# Patient Record
Sex: Female | Born: 1965 | Race: White | Hispanic: No | Marital: Married | Smoking: Former smoker
Health system: Southern US, Community
[De-identification: ages and names within clinical notes are randomized; demographics above are authoritative.]

## PROBLEM LIST (undated history)

## (undated) DIAGNOSIS — K5090 Crohn's disease, unspecified, without complications: Secondary | ICD-10-CM

## (undated) DIAGNOSIS — R079 Chest pain, unspecified: Secondary | ICD-10-CM

## (undated) DIAGNOSIS — I10 Essential (primary) hypertension: Secondary | ICD-10-CM

## (undated) DIAGNOSIS — R002 Palpitations: Secondary | ICD-10-CM

## (undated) DIAGNOSIS — E785 Hyperlipidemia, unspecified: Secondary | ICD-10-CM

## (undated) DIAGNOSIS — Z9289 Personal history of other medical treatment: Secondary | ICD-10-CM

## (undated) DIAGNOSIS — R0989 Other specified symptoms and signs involving the circulatory and respiratory systems: Secondary | ICD-10-CM

## (undated) DIAGNOSIS — D573 Sickle-cell trait: Secondary | ICD-10-CM

## (undated) HISTORY — DX: Personal history of other medical treatment: Z92.89

## (undated) HISTORY — DX: Crohn's disease, unspecified, without complications: K50.90

## (undated) HISTORY — DX: Chest pain, unspecified: R07.9

## (undated) HISTORY — DX: Palpitations: R00.2

## (undated) HISTORY — DX: Sickle-cell trait: D57.3

## (undated) HISTORY — DX: Hyperlipidemia, unspecified: E78.5

## (undated) HISTORY — DX: Essential (primary) hypertension: I10

## (undated) HISTORY — DX: Other specified symptoms and signs involving the circulatory and respiratory systems: R09.89

---

## 1998-04-02 ENCOUNTER — Encounter: Payer: Self-pay | Admitting: Gastroenterology

## 1998-04-02 ENCOUNTER — Ambulatory Visit (HOSPITAL_COMMUNITY): Admission: RE | Admit: 1998-04-02 | Discharge: 1998-04-02 | Payer: Self-pay | Admitting: Gastroenterology

## 1998-04-20 ENCOUNTER — Ambulatory Visit (HOSPITAL_COMMUNITY): Admission: RE | Admit: 1998-04-20 | Discharge: 1998-04-20 | Payer: Self-pay | Admitting: Gastroenterology

## 1998-04-26 ENCOUNTER — Encounter: Payer: Self-pay | Admitting: Gastroenterology

## 1998-04-26 ENCOUNTER — Ambulatory Visit (HOSPITAL_COMMUNITY): Admission: RE | Admit: 1998-04-26 | Discharge: 1998-04-26 | Payer: Self-pay | Admitting: Gastroenterology

## 1998-05-10 ENCOUNTER — Other Ambulatory Visit: Admission: RE | Admit: 1998-05-10 | Discharge: 1998-05-10 | Payer: Self-pay | Admitting: Obstetrics and Gynecology

## 1998-05-10 ENCOUNTER — Emergency Department (HOSPITAL_COMMUNITY): Admission: EM | Admit: 1998-05-10 | Discharge: 1998-05-10 | Payer: Self-pay | Admitting: Internal Medicine

## 1998-05-10 ENCOUNTER — Encounter: Payer: Self-pay | Admitting: Internal Medicine

## 1998-06-20 ENCOUNTER — Other Ambulatory Visit: Admission: RE | Admit: 1998-06-20 | Discharge: 1998-06-20 | Payer: Self-pay | Admitting: Obstetrics and Gynecology

## 1998-10-25 ENCOUNTER — Encounter: Payer: Self-pay | Admitting: Gastroenterology

## 1998-10-25 ENCOUNTER — Ambulatory Visit (HOSPITAL_COMMUNITY): Admission: RE | Admit: 1998-10-25 | Discharge: 1998-10-25 | Payer: Self-pay | Admitting: Gastroenterology

## 1998-11-26 ENCOUNTER — Ambulatory Visit (HOSPITAL_COMMUNITY): Admission: RE | Admit: 1998-11-26 | Discharge: 1998-11-26 | Payer: Self-pay | Admitting: Obstetrics and Gynecology

## 1999-05-09 ENCOUNTER — Other Ambulatory Visit: Admission: RE | Admit: 1999-05-09 | Discharge: 1999-05-09 | Payer: Self-pay | Admitting: Obstetrics and Gynecology

## 1999-05-17 ENCOUNTER — Emergency Department (HOSPITAL_COMMUNITY): Admission: EM | Admit: 1999-05-17 | Discharge: 1999-05-17 | Payer: Self-pay | Admitting: Emergency Medicine

## 1999-09-08 ENCOUNTER — Encounter: Payer: Self-pay | Admitting: Neurosurgery

## 1999-09-08 ENCOUNTER — Ambulatory Visit (HOSPITAL_COMMUNITY): Admission: RE | Admit: 1999-09-08 | Discharge: 1999-09-08 | Payer: Self-pay | Admitting: Neurosurgery

## 1999-09-19 ENCOUNTER — Encounter: Admission: RE | Admit: 1999-09-19 | Discharge: 1999-09-19 | Payer: Self-pay | Admitting: Neurosurgery

## 1999-09-19 ENCOUNTER — Encounter: Payer: Self-pay | Admitting: Neurosurgery

## 2000-07-29 ENCOUNTER — Other Ambulatory Visit: Admission: RE | Admit: 2000-07-29 | Discharge: 2000-07-29 | Payer: Self-pay | Admitting: Obstetrics and Gynecology

## 2000-09-03 ENCOUNTER — Encounter: Payer: Self-pay | Admitting: Gastroenterology

## 2000-09-03 ENCOUNTER — Ambulatory Visit (HOSPITAL_COMMUNITY): Admission: RE | Admit: 2000-09-03 | Discharge: 2000-09-03 | Payer: Self-pay | Admitting: Gastroenterology

## 2001-08-17 ENCOUNTER — Other Ambulatory Visit: Admission: RE | Admit: 2001-08-17 | Discharge: 2001-08-17 | Payer: Self-pay | Admitting: Obstetrics and Gynecology

## 2002-02-11 ENCOUNTER — Encounter: Payer: Self-pay | Admitting: Obstetrics and Gynecology

## 2002-02-11 ENCOUNTER — Encounter: Admission: RE | Admit: 2002-02-11 | Discharge: 2002-02-11 | Payer: Self-pay | Admitting: Obstetrics and Gynecology

## 2002-05-28 ENCOUNTER — Encounter: Payer: Self-pay | Admitting: Emergency Medicine

## 2002-05-28 ENCOUNTER — Emergency Department (HOSPITAL_COMMUNITY): Admission: EM | Admit: 2002-05-28 | Discharge: 2002-05-29 | Payer: Self-pay | Admitting: Emergency Medicine

## 2002-08-23 ENCOUNTER — Other Ambulatory Visit: Admission: RE | Admit: 2002-08-23 | Discharge: 2002-08-23 | Payer: Self-pay | Admitting: Obstetrics and Gynecology

## 2003-01-02 ENCOUNTER — Observation Stay (HOSPITAL_COMMUNITY): Admission: RE | Admit: 2003-01-02 | Discharge: 2003-01-03 | Payer: Self-pay | Admitting: General Surgery

## 2003-01-02 ENCOUNTER — Encounter (INDEPENDENT_AMBULATORY_CARE_PROVIDER_SITE_OTHER): Payer: Self-pay

## 2003-12-18 ENCOUNTER — Other Ambulatory Visit: Admission: RE | Admit: 2003-12-18 | Discharge: 2003-12-18 | Payer: Self-pay | Admitting: Obstetrics and Gynecology

## 2003-12-22 ENCOUNTER — Ambulatory Visit (HOSPITAL_BASED_OUTPATIENT_CLINIC_OR_DEPARTMENT_OTHER): Admission: RE | Admit: 2003-12-22 | Discharge: 2003-12-22 | Payer: Self-pay | Admitting: Obstetrics and Gynecology

## 2003-12-22 ENCOUNTER — Ambulatory Visit (HOSPITAL_COMMUNITY): Admission: RE | Admit: 2003-12-22 | Discharge: 2003-12-22 | Payer: Self-pay | Admitting: Obstetrics and Gynecology

## 2003-12-22 ENCOUNTER — Encounter (INDEPENDENT_AMBULATORY_CARE_PROVIDER_SITE_OTHER): Payer: Self-pay | Admitting: *Deleted

## 2006-09-10 ENCOUNTER — Encounter: Admission: RE | Admit: 2006-09-10 | Discharge: 2006-09-10 | Payer: Self-pay | Admitting: Obstetrics and Gynecology

## 2008-04-20 ENCOUNTER — Ambulatory Visit: Payer: Self-pay | Admitting: Diagnostic Radiology

## 2008-04-20 ENCOUNTER — Ambulatory Visit (HOSPITAL_BASED_OUTPATIENT_CLINIC_OR_DEPARTMENT_OTHER): Admission: RE | Admit: 2008-04-20 | Discharge: 2008-04-20 | Payer: Self-pay | Admitting: Family Medicine

## 2008-05-03 ENCOUNTER — Encounter: Admission: RE | Admit: 2008-05-03 | Discharge: 2008-05-03 | Payer: Self-pay

## 2008-05-09 DIAGNOSIS — R0989 Other specified symptoms and signs involving the circulatory and respiratory systems: Secondary | ICD-10-CM

## 2008-05-09 HISTORY — DX: Other specified symptoms and signs involving the circulatory and respiratory systems: R09.89

## 2009-06-19 ENCOUNTER — Encounter: Admission: RE | Admit: 2009-06-19 | Discharge: 2009-06-19 | Payer: Self-pay | Admitting: Family Medicine

## 2009-09-24 ENCOUNTER — Encounter: Admission: RE | Admit: 2009-09-24 | Discharge: 2009-09-24 | Payer: Self-pay | Admitting: Gastroenterology

## 2010-06-01 IMAGING — US US ABDOMEN COMPLETE
1 series · 14 of 25 positions shown · non-contrast
Comparison: None

CLINICAL DATA: Left upper quadrant pain.  Evaluate for
pancreatitis.  Cholecystectomy.  Appendectomy and hysterectomy.
Crohns disease.

ABDOMEN ULTRASOUND
TECHNIQUE: Complete abdominal ultrasound examination was performed
including evaluation of the liver, gallbladder, bile ducts,
pancreas, kidneys, spleen, IVC, and abdominal aorta.

[Series 1: us abdomen complete · 0.35mm/px · 14 of 59 slices shown]
[im 1/59]
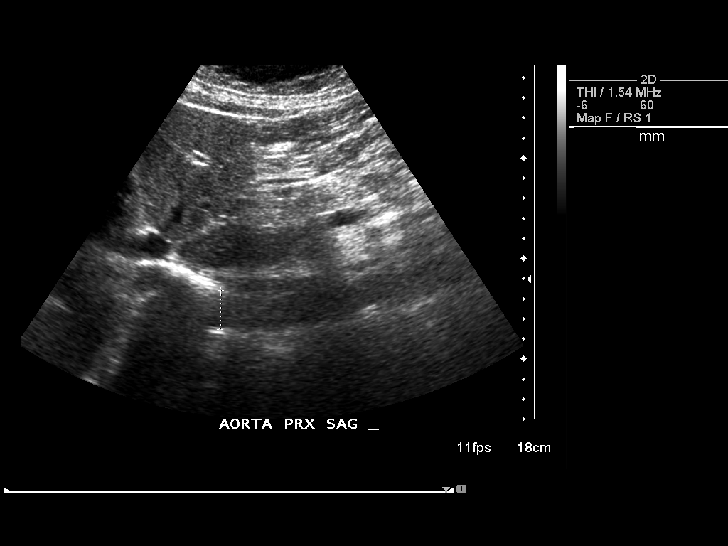
[im 5/59]
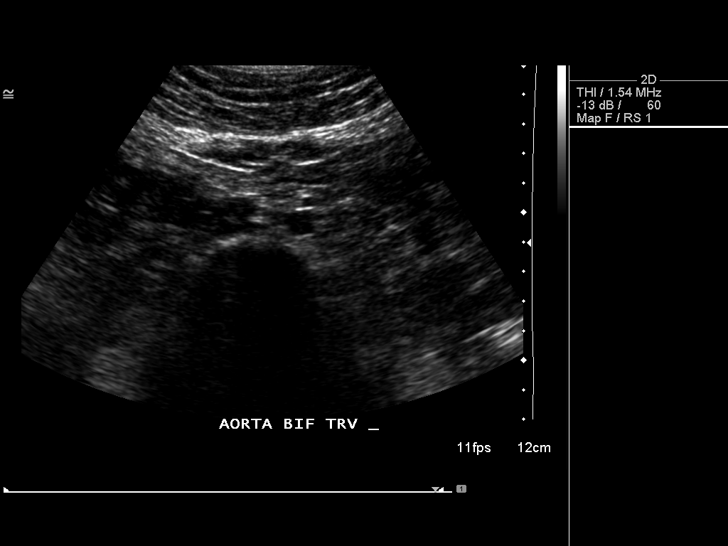
[im 10/59]
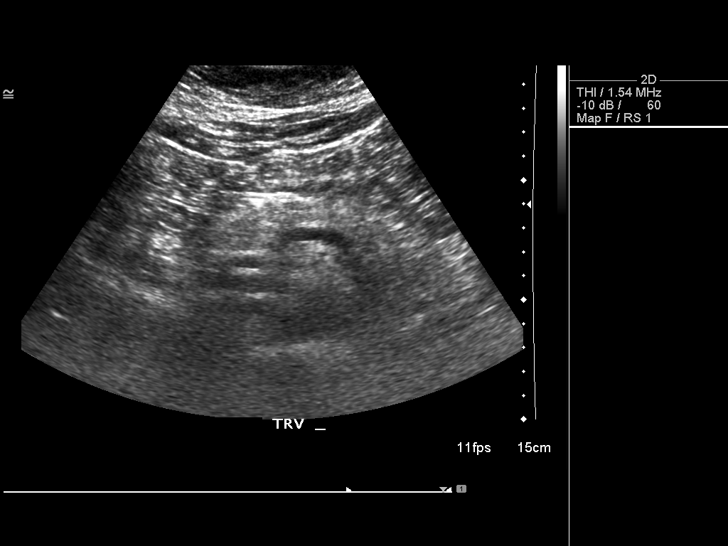
[im 15/59]
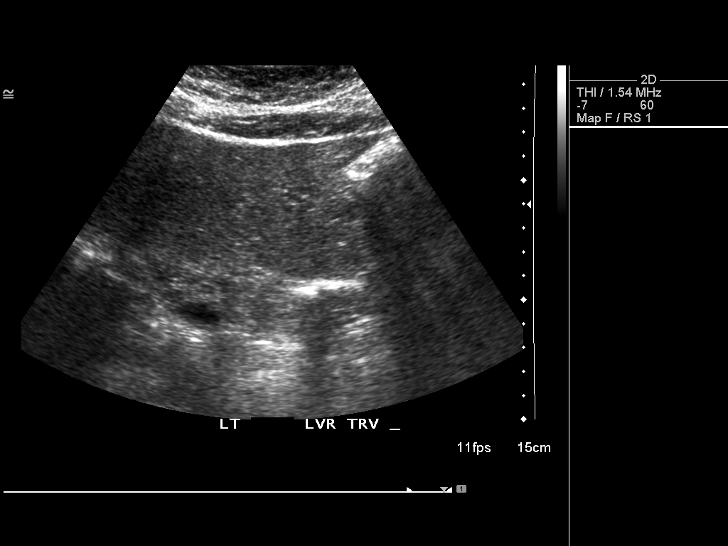
[im 20/59]
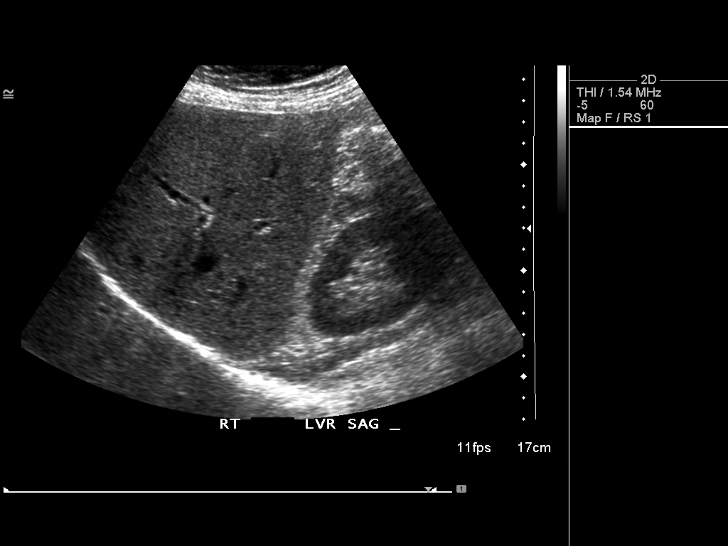
[im 22/59]
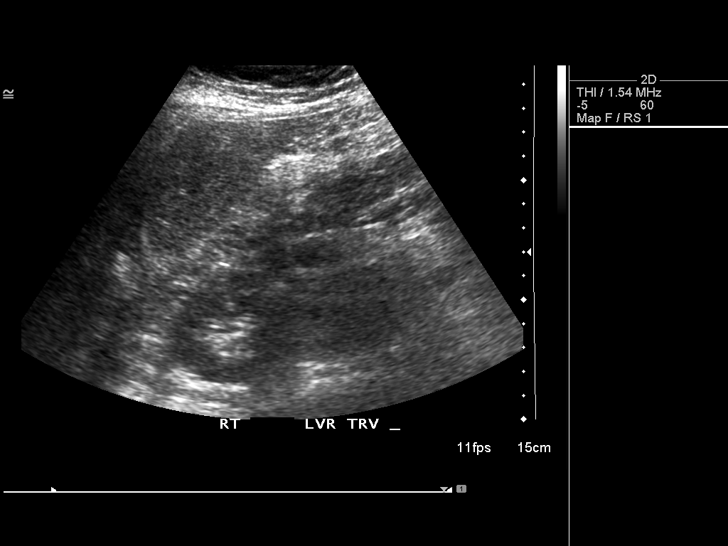
[im 27/59]
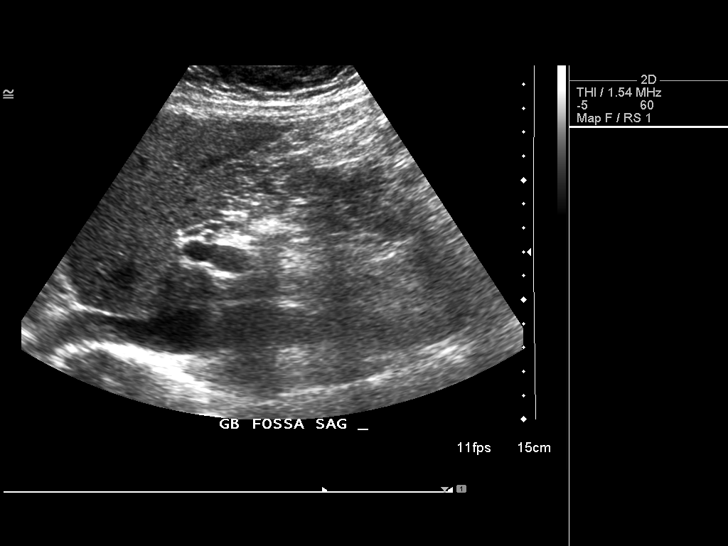
[im 32/59]
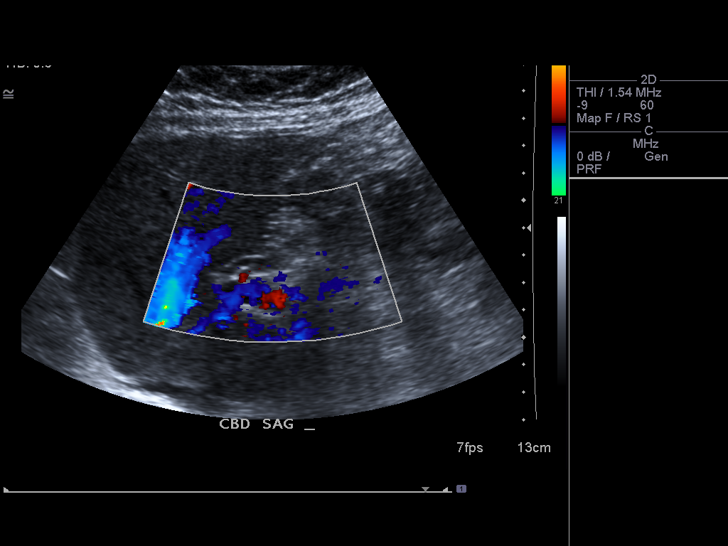
[im 37/59]
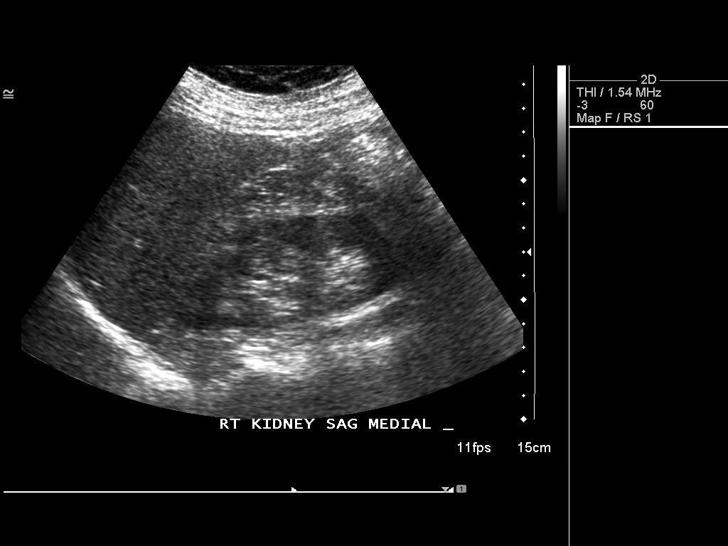
[im 39/59]
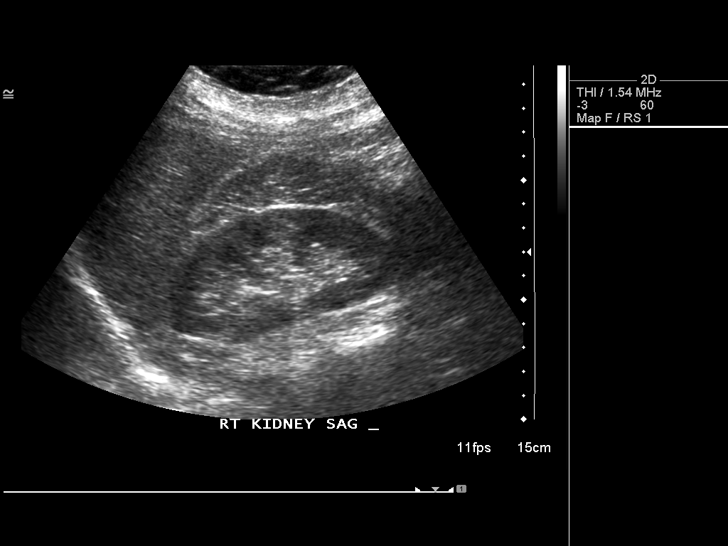
[im 44/59]
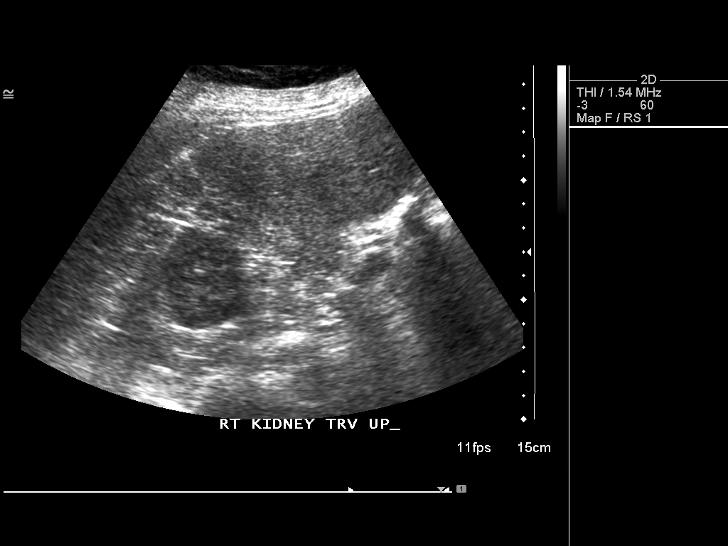
[im 49/59]
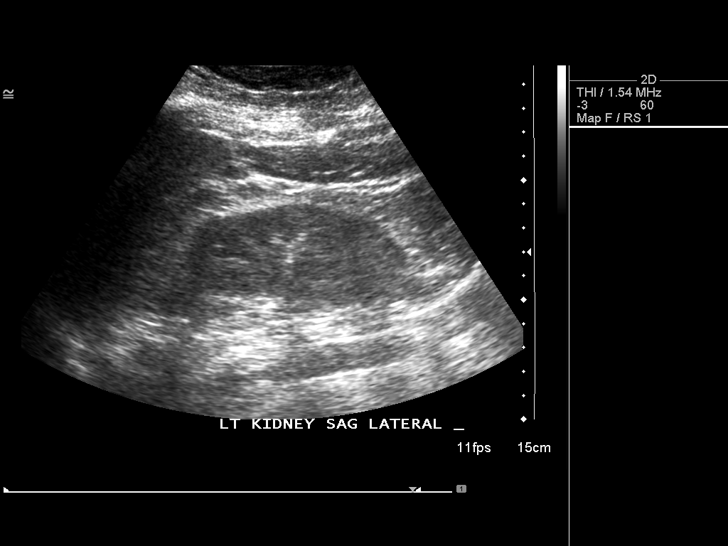
[im 54/59]
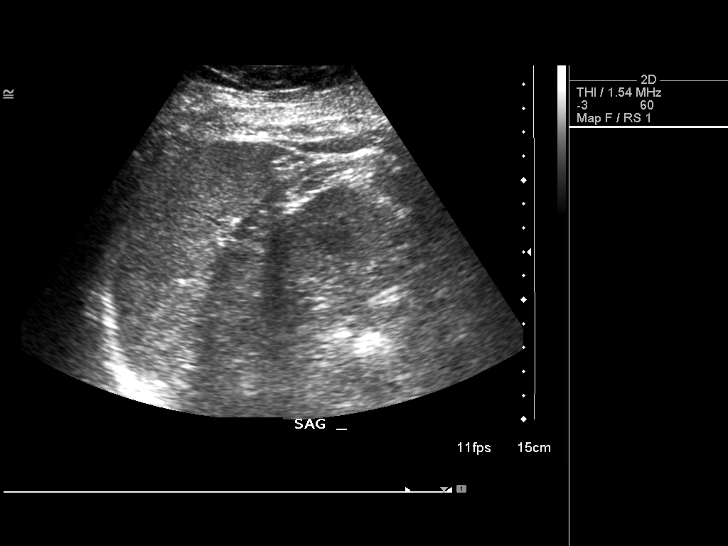
[im 59/59]
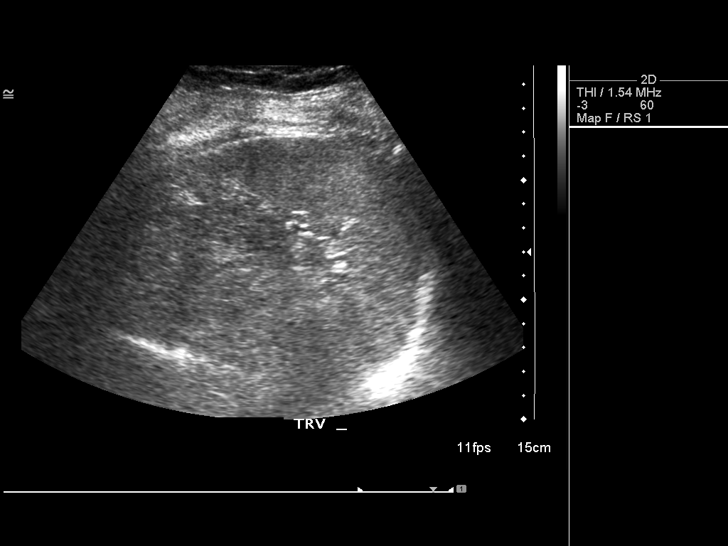

[14 of 25 positions shown; findings below may reference images not displayed]

FINDINGS: The gallbladder has been removed.  The common bile duct
is 3.4 mm.  The liver appears normal.  The IVC is not well seen due
to bowel gas and body habitus.  Pancreas is not enlarged and there
is no pancreatic mass or edema.  There is no peripancreatic fluid.
The spleen is 11.5 cm in length.  The kidneys are normal and the
aorta shows no dilatation, measuring 2.0 cm in diameter.
IMPRESSION: No acute abnormality.  There is no biliary dilatation following
cholecystectomy.

## 2010-06-28 NOTE — Op Note (Signed)
Joan Richardson, [name]                   ACCOUNT NO.:  [id number]   MEDICAL RECORD NO.:  [id number]          PATIENT TYPE:  AMB   LOCATION:  [facility]                         FACILITY:  [facility]   PHYSICIAN:  Cynthia P. Romine, M.D.DATE OF BIRTH:  [birth year]   DATE OF PROCEDURE:  12/22/2003  DATE OF DISCHARGE:                                 OPERATIVE REPORT   PREOPERATIVE DIAGNOSES:  Known endometrial polyp.   POSTOPERATIVE DIAGNOSES:  Known endometrial polyp. Pathology pending.   PROCEDURE:  Hysteroscopic resection of endometrial polyp, D&C.   SURGEON:  Cynthia P. Romine, M.D.   ANESTHESIA:  General by LMA.   ESTIMATED BLOOD LOSS:  Minimal.   COMPLICATIONS:  None.   SORBITOL DEFICIT:  40 mL.   DESCRIPTION OF PROCEDURE:  The patient was taken to the operating room and  after the induction of adequate general anesthesia by LMA was placed in  dorsal lithotomy position and prepped and draped in the usual fashion.  A  posterior weighted and anterior Sims retractors were placed, the cervix was  grasped off the anterior lip with a single tooth tenaculum.  The uterus  sounded to 8 cm.  The cervix was dilated to a #31 Kennon Rounds, the operative  hysteroscope was introduced using sorbitol for distention medium and the  sorbitol pump was set at a pressure of 70.  The polyp could be seen coming  from its anterior fundal location.  The remainder of the endometrial cavity  appeared clear.  In order to get good visualization, the pressure on the  pump needed to be turned up to 85 mmHg. A single loop was used to resect the  polyp in several pieces. The hysteroscope was withdrawn, sharp curettage was  carried out and the hysteroscope was then reintroduced. The cavity appeared  clear and the procedure was terminated. The instrument was removed from the  vagina and the patient was taken to the recovery room in satisfactory  condition.      CPR/MEDQ  D:  12/22/2003  T:  12/23/2003  Job:  [id number]

## 2010-06-28 NOTE — Op Note (Signed)
Joan Richardson, Joan Richardson                             ACCOUNT NO.:  [id number]   MEDICAL RECORD NO.:  [id number]                   PATIENT TYPE:  OBV   LOCATION:  9741                                 FACILITY:  [facility]   PHYSICIAN:  Cynthia P. Romine, M.D.             DATE OF BIRTH:  [birth year]   DATE OF PROCEDURE:  01/02/2003  DATE OF DISCHARGE:  01/03/2003                                 OPERATIVE REPORT   PREOPERATIVE DIAGNOSIS:  Desire for attempt at permanent surgical  sterilization.   POSTOPERATIVE DIAGNOSIS:  Desire for attempt at permanent surgical  sterilization.   PROCEDURE:  Laparoscopic Falope ring bilateral tubal sterilization.   SURGEON:  Cynthia P. Romine, M.D.   ANESTHESIA:  General endotracheal.   ESTIMATED BLOOD LOSS:  None.   COMPLICATIONS:  None.   DESCRIPTION OF PROCEDURE:  The patient had already had laparoscopy done to  remove her gallbladder by Dr. Marlou Starks and it was after the gallbladder was  removed that I came to the operating room to perform the tubal sterilization  procedure.  The umbilical port already was in place and inspection of the  pelvis revealed it to be normal.  A small incision was made suprapubically  and an 8-mm trocar was inserted under direct visualization.  The Falope ring  applier was introduced, the patient's right fallopian tube was identified  and traced to its fimbriated end, the midportion was elevated and the Falope  ring was placed.  A good knuckle of tube was noted to be contained within  the ring and good blanching was noted.  The procedure was repeated on the  patient's left, identifying the tube and tracing it to its fimbriated end.  The midportion was elevated and the Falope ring was placed.  Again, a good  knuckle of tube was noted to be contained within the ring and good blanching  of the tube was evident.  There was no bleeding.  Photographic documentation  was taken and my part of the procedure was then terminated with Dr.   Marlou Starks to  close the incision.  The Falope ring applier and the sleeve were removed and  the suprapubic incision was closed subcuticularly with 4-0 Dexon.                                               Cynthia P. Romine, M.D.    CPR/MEDQ  D:  02/14/2003  T:  02/14/2003  Job:  [id number]

## 2010-06-28 NOTE — Op Note (Signed)
NAMEKIRSTY, Joan Richardson                             ACCOUNT NO.:  [id number]   MEDICAL RECORD NO.:  [id number]                   PATIENT TYPE:  OBV   LOCATION:  8676                                 FACILITY:  [facility]   PHYSICIAN:  Sammuel Hines. Daiva Nakayama, M.D.              DATE OF BIRTH:  August 20, 1965   DATE OF PROCEDURE:  01/02/2003  DATE OF DISCHARGE:                                 OPERATIVE REPORT   PREOPERATIVE DIAGNOSIS:  Biliary dyskinesia.   POSTOPERATIVE DIAGNOSIS:  Biliary dyskinesia.   OPERATION/PROCEDURE:  Laparoscopic cholecystectomy.   OPERATION/PROCEDURE:  Laparoscopic cholecystectomy.   SURGEON:  Sammuel Hines. Marlou Starks, M.D.   ASSISTANT:  Shellia Carwin, M.D.   ANESTHESIA:  General endotracheal.   DESCRIPTION OF PROCEDURE:  After informed consent was obtained, the patient  was brought to the operating room and placed in the supine position on the  operating room table.  After adequate induction of general anesthesia, the  patient was placed in the lithotomy position so that she could have a tubal  ligation by GYN during this same procedure.  Her abdomen and perineum were  then prepped with Betadine and draped in the usual sterile manner.  The area  below the umbilicus was infiltrated with 0.25% Marcaine.  A small incision  was made with the 15-blade knife.  This incision was carried down through  the subcutaneous tissue bluntly with the Kelly clamp and [employer]  retractors until the linea alba was identified.  The linea alba was incised  with the 15-blade knife and each side was grasped with Kocher clamps and  elevated anteriorly.  The preperitoneal space was then probed bluntly with a  hemostat until the peritoneum was opened and access was gained to the  abdominal cavity.  A 0 Vicryl figure-of-eight stitch was placed in the  fascia surrounding the opening. The Hasson cannula was placed through the  opening and anchored in place with the previously placed Vicryl stitch.  The  abdomen was then insufflated with carbon dioxide without difficulty.  The  patient was placed in a head-up position.  The laparoscope was placed  through the Hasson cannula and the right upper quadrant was inspected.  The  liver and dome of the gallbladder were readily identified.  The epigastric  region was then infiltrated with 0.25% Marcaine.  A small incision was made  with the 15-blade knife and a 10 mm port was placed bluntly through this  incision into the abdominal cavity under direct vision.  Sites were then  chosen laterally on the right side of the abdomen for placement of a 5 mm  ports.  Each of these areas was infiltrated with 0.25% Marcaine.  Small stab  incisions were made with 15-blade knife and 5 mm ports were placed bluntly  through these incisions into the abdominal cavity under direct vision.  A  blunt grasper was placed through the  lateral-most 5 mm port and used to  grasp the dome of the gallbladder and elevate it anteriorly and superiorly.  Another blunt grasper was placed through the other 5 mm port and used to  retract on the body and neck of the gallbladder.  The dissector was placed  through the epigastric port and using the electrocautery, the peritoneal  reflection at the gallbladder neck was opened.  Blunt dissection was then  carried out in this area until the gallbladder neck-cystic duct junction was  readily identified and a good window was created.  The anatomy was very  clear.  Since the patient had normal liver functions and was in a position  on the table to have both procedures done, this position would not allow  cholangiogram to be done and two clips were placed proximally on the cystic  duct and one distally and the duct was divided between the two sets of  clips.  Posterior to this, the cystic artery was identified and again  dissected in a circumferential manner bluntly until a good window was  created.  Three clips were placed proximally and one  distally on the artery  and the artery was divided between the two.  Next, the laparoscopic hook  cautery device was used to separate the gallbladder from the liver bed.  Prior to completely detaching the gallbladder from the liver bed, the liver  bed was inspected and several small bleeding points were coagulated with the  electrocautery.  The gallbladder was then detached the rest of the way from  the liver bed without difficulty.  The laparoscope was then moved to the  epigastric port and a gallbladder grasper was placed through the Hasson  cannula and used to grasp gallbladder.  The gallbladder was then removed  with the Hasson cannula through the infraumbilical port without difficulty.  The Hasson cannula was then replaced.   The procedure was then turned over to Dr. Joan Flores for a bilateral tubal  ligation.  Her portion of the procedure will be dictated separately.   Once her portion of the procedure was complete, we reinspected the liver bed  which was completely hemostatic.  The ports were then all removed under  direct vision and found to be hemostatic.  The gas was allowed to escape.  The fascial defect at the umbilicus was closed with the previously placed  Vicryl stitch as well as with another interrupted Vicryl stitch. The skin  incisions were all closed with interrupted 4-0 Monocryl subcuticular  stitches.  Benzoin and Steri-Strips and sterile dressings were applied.  The  patient tolerated the procedure well.  At the end of the case all needle,  sponge and instrument counts were correct.  The patient was awakened and  taken to the recovery room in stable condition.                                               Sammuel Hines. Daiva Nakayama, M.D.    PST/MEDQ  D:  01/02/2003  T:  01/02/2003  Job:  [id number]

## 2010-12-17 ENCOUNTER — Other Ambulatory Visit: Payer: Self-pay | Admitting: Family Medicine

## 2010-12-17 DIAGNOSIS — Z1231 Encounter for screening mammogram for malignant neoplasm of breast: Secondary | ICD-10-CM

## 2010-12-18 ENCOUNTER — Ambulatory Visit
Admission: RE | Admit: 2010-12-18 | Discharge: 2010-12-18 | Disposition: A | Discharge status: Home / Self Care | Payer: BC Managed Care – PPO | Source: Ambulatory Visit | Attending: Family Medicine | Admitting: Family Medicine

## 2010-12-18 ENCOUNTER — Other Ambulatory Visit: Payer: Self-pay | Admitting: Family Medicine

## 2010-12-18 DIAGNOSIS — Z1231 Encounter for screening mammogram for malignant neoplasm of breast: Secondary | ICD-10-CM

## 2010-12-31 ENCOUNTER — Ambulatory Visit: Payer: Self-pay

## 2011-08-13 ENCOUNTER — Ambulatory Visit (HOSPITAL_BASED_OUTPATIENT_CLINIC_OR_DEPARTMENT_OTHER)
Admission: RE | Admit: 2011-08-13 | Discharge: 2011-08-13 | Disposition: A | Discharge status: Home / Self Care | Payer: BC Managed Care – PPO | Source: Ambulatory Visit | Attending: Family Medicine | Admitting: Family Medicine

## 2011-08-13 ENCOUNTER — Other Ambulatory Visit (HOSPITAL_BASED_OUTPATIENT_CLINIC_OR_DEPARTMENT_OTHER): Payer: Self-pay | Admitting: Family Medicine

## 2011-08-13 DIAGNOSIS — M542 Cervicalgia: Secondary | ICD-10-CM | POA: Insufficient documentation

## 2011-08-13 DIAGNOSIS — R599 Enlarged lymph nodes, unspecified: Secondary | ICD-10-CM | POA: Insufficient documentation

## 2011-08-13 MED ORDER — IOHEXOL 300 MG/ML  SOLN
80.0000 mL | Freq: Once | INTRAMUSCULAR | Status: AC | PRN
  Administered 2011-08-13: 80 mL via INTRAVENOUS

## 2011-12-10 DIAGNOSIS — I10 Essential (primary) hypertension: Secondary | ICD-10-CM

## 2011-12-10 HISTORY — DX: Essential (primary) hypertension: I10

## 2011-12-22 DIAGNOSIS — R002 Palpitations: Secondary | ICD-10-CM

## 2011-12-22 HISTORY — DX: Palpitations: R00.2

## 2012-02-26 ENCOUNTER — Other Ambulatory Visit: Payer: Self-pay | Admitting: Family Medicine

## 2012-02-26 DIAGNOSIS — Z1231 Encounter for screening mammogram for malignant neoplasm of breast: Secondary | ICD-10-CM

## 2012-03-23 ENCOUNTER — Ambulatory Visit
Admission: RE | Admit: 2012-03-23 | Discharge: 2012-03-23 | Disposition: A | Discharge status: Home / Self Care | Payer: BC Managed Care – PPO | Source: Ambulatory Visit | Attending: Family Medicine | Admitting: Family Medicine

## 2012-03-23 DIAGNOSIS — Z1231 Encounter for screening mammogram for malignant neoplasm of breast: Secondary | ICD-10-CM

## 2012-08-12 ENCOUNTER — Encounter: Payer: Self-pay | Admitting: *Deleted

## 2012-08-12 ENCOUNTER — Encounter: Payer: Self-pay | Admitting: Cardiovascular Disease

## 2012-08-17 ENCOUNTER — Encounter: Payer: Self-pay | Admitting: Cardiovascular Disease

## 2012-08-17 ENCOUNTER — Ambulatory Visit (INDEPENDENT_AMBULATORY_CARE_PROVIDER_SITE_OTHER): Payer: BC Managed Care – PPO | Admitting: Cardiovascular Disease

## 2012-08-17 VITALS — BP 104/76 | HR 73 | Ht 60.0 in | Wt 173.8 lb

## 2012-08-17 DIAGNOSIS — R5381 Other malaise: Secondary | ICD-10-CM

## 2012-08-17 DIAGNOSIS — R079 Chest pain, unspecified: Secondary | ICD-10-CM

## 2012-08-17 DIAGNOSIS — R0602 Shortness of breath: Secondary | ICD-10-CM | POA: Insufficient documentation

## 2012-08-17 DIAGNOSIS — E782 Mixed hyperlipidemia: Secondary | ICD-10-CM

## 2012-08-17 DIAGNOSIS — I10 Essential (primary) hypertension: Secondary | ICD-10-CM

## 2012-08-17 DIAGNOSIS — R5383 Other fatigue: Secondary | ICD-10-CM

## 2012-08-17 DIAGNOSIS — Z8249 Family history of ischemic heart disease and other diseases of the circulatory system: Secondary | ICD-10-CM

## 2012-08-17 DIAGNOSIS — K5090 Crohn's disease, unspecified, without complications: Secondary | ICD-10-CM | POA: Insufficient documentation

## 2012-08-17 NOTE — Progress Notes (Signed)
Patient ID: Joan Richardson, female   DOB: February 02, 1966, 47 y.o.   MRN: [id number]     HPI: Joan Richardson, is a 47 y.o. female who presents to the office today for cardiology followup evaluation. I last saw her proximally 7 months ago.  Joan Richardson has a strong family history for premature coronary artery disease in multiple family members with several uncles having suffered myocardial infarctions in their late 55s and 51s. Her mother recently underwent stenting several weeks ago. The patient has experienced episodes of chest pain which at times are described as sharp and other times it is dull. At times there is left arm and neck radiation. She does note shortness of breath with walking. In October 2013 a nuclear perfusion study was normal. She has continued to experience symptoms.  There is remote tobacco history having started at age 48 but smoked more consistently at age 72. She quit smoking in 1994. She also has a history of hypertension, as well as mild hyperlipidemia. There is a history of Crohn's disease. 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 She's unaware of any African American heritage but she does admit to in the [facility] remotely.  In October 2013 an echo Doppler study showed normal systolic function but grade 1 diastolic dysfunction. She had mild MR and mild TR.  Past Medical History  Diagnosis Date  . Chest pain 12/10/11    stess test-post stress EF 85%- Exercise capacity is normal. Normal myocardial perfusion study. Low risk scan. No study for comparison.  . Palpitations 12/22/11  . Hyperlipidemia   . Sickle cell trait   . Hypertension 12/10/11    Echo- normal dystolic function grade 1 diastolic dysfuction. Mitral Valve E to A ratio is 0.65. evidence for mild MR and mild TR without other significant abnormalities.  . Crohn's disease   . Carotid bruit 05/09/08    carotid doppler-normal    No past surgical history on file.  Allergies  Allergen Reactions  . Imuran (Azathioprine)     . Lialda (Mesalamine)   . Septra (Sulfamethoxazole-Tmp Ds)     Current Outpatient Prescriptions  Medication Sig Dispense Refill  . ALPRAZolam (XANAX) 0.5 MG tablet Take 0.5 mg by mouth as needed.      . clidinium-chlordiazePOXIDE (LIBRAX) 2.5-5 MG per capsule Take 1 capsule by mouth as needed.      . hyoscyamine (LEVBID) 0.375 MG 12 hr tablet       . losartan-hydrochlorothiazide (HYZAAR) 50-12.5 MG per tablet Take 1 tablet by mouth daily.      . pantoprazole (PROTONIX) 20 MG tablet Take 20 mg by mouth 2 (two) times daily.      . traMADol (ULTRAM) 50 MG tablet Take 50 mg by mouth every 6 (six) hours as needed for pain.       No current facility-administered medications for this visit.    Socially she is married and has one child. She is trying to increase her exercise level. There is no recent tobacco use. There is no alcohol.  ROS is negative for fevers, chills or night sweats. She denies palpitations. She does admit to shortness of breath with walking. At times again she has noticed a sharp chest pain which is nonexertional but other times she does note a squeezing-type sensation. She denies bleeding. She denies recent exacerbation of her Crohn's disease. There has been discussion about use of Humira for her Crohn's disease. She denies edema  Other system review is negative.  PE BP  104/76  Pulse 73  Ht 5' (1.524 m)  Wt 173 lb 12.8 oz (78.835 kg)  BMI 33.94 kg/m2  General: Alert, oriented, no distress.  Skin: normal turgor, no rashes HEENT: Normocephalic, atraumatic. Pupils round and reactive; sclera anicteric;no lid lag.  Nose without nasal septal hypertrophy Mouth/Parynx benign; Mallinpatti scale 2 Neck: No JVD, no carotid briuts Lungs: clear to ausculatation and percussion; no wheezing or rales Heart: RRR, s1 s2 normal 1/6 systolic murmur. Her chest wall is nontender. Abdomen: soft, nontender; no hepatosplenomehaly, BS+; abdominal aorta nontender and not dilated by  palpation. Pulses 2+ Extremities: no clubbing cyanosis or edema, Homan's sign negative  Neurologic: grossly nonfocal  ECG: Sinus rhythm at 73 beats per minute without ectopy. She had normal intervals.  LABS:  BMET No results found for this basename: na, k, cl, co2, glucose, bun, creatinine, calcium, gfrnonaa, gfraa     Hepatic Function Panel  No results found for this basename: prot, albumin, ast, alt, alkphos, bilitot, bilidir, ibili     CBC No results found for this basename: wbc, rbc, hgb, hct, plt, mcv, mch, mchc, rdw, neutrabs, lymphsabs, monoabs, eosabs, basosabs     BNP No results found for this basename: probnp    Lipid Panel  No results found for this basename: chol, trig, hdl, cholhdl, vldl, ldlcalc     RADIOLOGY: No results found.    ASSESSMENT AND PLAN: This Joan Richardson is now 47 years old. Has a very strong family history for premature coronary artery disease and has cardiac risk factors also notable for hypertension, mild hyperlipidemia, as well as remote tobacco use. She previously had undergone a nuclear perfusion scan in October 2013 which showed normal perfusion. She has continued to experience shortness of breath particularly with exertion. I'm recommending she undergo a cardiopulmonary met test for further evaluation of maximal oxygen consumption, and to risk stratify for potential exercise-induced cardiac dysfunction in the etiology of her dyspnea versus pulmonary etiology. She may very well have microvascular angina. Subsequent laparoscopy obtained including a CBC with differential, comprehensive metabolic panel, NMR liposcience profile, TSH, vitamin D level. I will see her in 6-8 weeks for followup evaluation.     Troy Sine, MD, [facility]  08/17/2012 7:13 PM

## 2012-08-17 NOTE — Patient Instructions (Signed)
Your physician recommends that you return for lab work fasting.  Your physician has recommended that you have a cardiopulmonary stress test (CPX). CPX testing is a non-invasive measurement of heart and lung function. It replaces a traditional treadmill stress test. This type of test provides a tremendous amount of information that relates not only to your present condition but also for future outcomes. This test combines measurements of you ventilation, respiratory gas exchange in the lungs, electrocardiogram (EKG), blood pressure and physical response before, during, and following an exercise protocol.  Your physician recommends that you schedule a follow-up appointment in: 2 MONTHS.

## 2012-08-19 LAB — COMPREHENSIVE METABOLIC PANEL
Albumin: 4.4 g/dL (ref 3.5–5.2)
Alkaline Phosphatase: 123 U/L — ABNORMAL HIGH (ref 39–117)
BUN: 10 mg/dL (ref 6–23)
CO2: 24 mEq/L (ref 19–32)
Glucose, Bld: 102 mg/dL — ABNORMAL HIGH (ref 70–99)
Potassium: 4.6 mEq/L (ref 3.5–5.3)
Total Bilirubin: 0.5 mg/dL (ref 0.3–1.2)

## 2012-08-19 LAB — TSH: TSH: 2.144 u[IU]/mL (ref 0.350–4.500)

## 2012-08-19 LAB — CBC WITH DIFFERENTIAL/PLATELET
Eosinophils Absolute: 1.1 10*3/uL — ABNORMAL HIGH (ref 0.0–0.7)
Hemoglobin: 12.9 g/dL (ref 12.0–15.0)
Lymphocytes Relative: 26 % (ref 12–46)
Lymphs Abs: 2.9 10*3/uL (ref 0.7–4.0)
MCH: 26.6 pg (ref 26.0–34.0)
Monocytes Relative: 9 % (ref 3–12)
Neutrophils Relative %: 55 % (ref 43–77)
RBC: 4.85 MIL/uL (ref 3.87–5.11)
WBC: 11.5 10*3/uL — ABNORMAL HIGH (ref 4.0–10.5)

## 2012-08-20 LAB — NMR LIPOPROFILE WITH LIPIDS
HDL Size: 8.7 nm — ABNORMAL LOW (ref >=9.2)
HDL-C: 43 mg/dL (ref >=40)
LDL (calc): 108 mg/dL — ABNORMAL HIGH (ref <100)
LDL Size: 21 nm (ref >20.5)
LP-IR Score: 60 — ABNORMAL HIGH (ref <=45)

## 2012-08-23 LAB — VITAMIN D 1,25 DIHYDROXY: Vitamin D 1, 25 (OH)2 Total: 29 pg/mL (ref 18–72)

## 2012-09-13 ENCOUNTER — Encounter (INDEPENDENT_AMBULATORY_CARE_PROVIDER_SITE_OTHER): Payer: BC Managed Care – PPO

## 2012-09-13 DIAGNOSIS — R079 Chest pain, unspecified: Secondary | ICD-10-CM

## 2012-09-13 DIAGNOSIS — R0602 Shortness of breath: Secondary | ICD-10-CM

## 2012-09-24 ENCOUNTER — Telehealth: Payer: Self-pay | Admitting: Cardiovascular Disease

## 2012-09-24 NOTE — Telephone Encounter (Signed)
Wants to get met test results from 09/13/12  She cked My Chart and not finding  Please call

## 2012-09-24 NOTE — Telephone Encounter (Signed)
Returned call.  Pt informed message received r/t results.  RN apologized that she had not received them and informed instructions per Lab Result note.  Pt verbalized understanding.  Stated she does not want to take a -statin if she doesn't have to.  Stated she was on Zetia before and wanted to know if she could do that.  Pt also asked for Met Test Results.  Pt informed Dr. Claiborne Billings will be notified as Met Test has not been interpreted and Dr. Claiborne Billings will have to decide if she can take Zetia.  Pt verbalized understanding.  Pt then c/o having increased SOB.  Offered appt w/ Extender and pt declined.  Stated she would wait to see Dr. Claiborne Billings.  Pt informed Dr. Claiborne Billings to be out of the office next week and advised she see a PA sooner if SOB has increased.  Pt verbalized understanding and agreed w/ plan.  While scheduling appt, Dr. Evette Georges scheduled opened for Monday.  Appt scheduled at 9:45am w/ Dr. Claiborne Billings.  Pt was offered appt today w/ PA and declined.  Agreed to go to ER for worsening symptoms before Monday.  Will get results at appt.

## 2012-09-27 ENCOUNTER — Encounter: Payer: Self-pay | Admitting: Cardiovascular Disease

## 2012-09-27 ENCOUNTER — Ambulatory Visit (INDEPENDENT_AMBULATORY_CARE_PROVIDER_SITE_OTHER): Payer: BC Managed Care – PPO | Admitting: Cardiovascular Disease

## 2012-09-27 VITALS — BP 112/82 | HR 74 | Ht 63.0 in | Wt 178.5 lb

## 2012-09-27 DIAGNOSIS — E8881 Metabolic syndrome: Secondary | ICD-10-CM

## 2012-09-27 DIAGNOSIS — R0602 Shortness of breath: Secondary | ICD-10-CM

## 2012-09-27 DIAGNOSIS — R079 Chest pain, unspecified: Secondary | ICD-10-CM

## 2012-09-27 DIAGNOSIS — Z8249 Family history of ischemic heart disease and other diseases of the circulatory system: Secondary | ICD-10-CM

## 2012-09-27 DIAGNOSIS — E785 Hyperlipidemia, unspecified: Secondary | ICD-10-CM

## 2012-09-27 DIAGNOSIS — I5189 Other ill-defined heart diseases: Secondary | ICD-10-CM

## 2012-09-27 DIAGNOSIS — I10 Essential (primary) hypertension: Secondary | ICD-10-CM

## 2012-09-27 DIAGNOSIS — I519 Heart disease, unspecified: Secondary | ICD-10-CM

## 2012-09-27 MED ORDER — SIMVASTATIN 20 MG PO TABS: 20.0000 mg | ORAL_TABLET | Freq: Every evening | ORAL | Status: DC

## 2012-09-27 NOTE — Patient Instructions (Addendum)
  Your physician has recommended you make the following change in your medication: start vit-d 2000 iu daily. Prescription for simvastatin 20 mg daily. This has already been sent to your pharmacy.  Your physician recommends that you return for lab work in: 3 MONTHS.  Your physician recommends that you schedule a follow-up appointment in: 4 MONTHS.

## 2012-09-27 NOTE — Progress Notes (Signed)
Patient ID: Joan Richardson, female   DOB: [birth year], 47 y.o.   MRN: [id number]     HPI: Joan Richardson, is a 47 y.o. female who presents to the office today for cardiology followup evaluation. I last saw her last since that time she did have followup laboratory and underwent a cardiopulmonary test.  [name] has a strong family history for premature coronary artery disease in multiple family members with several uncles having suffered myocardial infarctions in their late 67s and 11s. Her mother recently underwent stenting several months ago. The patient has experienced episodes of chest pain which at times are described as sharp and other times as dull. At times there is left arm and neck radiation. She does note shortness of breath with walking. In October 2013 a nuclear perfusion study was normal. She has continued to experience symptoms.  There is remote tobacco history having started at age 43 but smoked more consistently at age 20. She quit smoking in 1994. She also has a history of hypertension, as well as mild hyperlipidemia. There is a history of Crohn's disease. 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 She's unaware of any African American heritage but she does admit to [place] remotely.  In October 2013 an echo Doppler study showed normal systolic function but grade 1 diastolic dysfunction. She had mild MR and mild TR.  Recent blood work has shown a total cholesterol 176 LDL calculated 108 LDL particle #1313 HDL particle number low at 29 and an increased insulin resistance scored 60.  On 09/13/2012 she underwent a cardiopulmonary met test. This revealed reduced functional status with maximum oxygen consumption of only 62% of predicted. Her cardiovascular response was impaired that study was considered indeterminate for myocardial dysfunction due to suboptimal peak cardiovascular stress load. She did have a low anaerobic threshold again suggestive of impaired peak cardiac function. There  also was evidence for ventilation/perfusion mismatch and possible increased her PFTs revealed normal FEV1 and FEV1/VC. DLCO was normal.  On prior echo, [name] did have normal systolic function with an ejection fraction greater than 55% and a grade 1 diastolic dysfunction the mitral valve E/A ratio of 0.65.  Apparently this past weekend, she developed episodes of chest pain leading to an evaluation at [facility] in [place]. She apparently had a normal chest CT. Cardiac enzymes were negative. Her chest pain was felt most likely non-cardiac. In addition, she tells me she has been followed by hematologist/oncologist for several months of low-grade fever and with the axillary lymph node. She has been anemic. Although the past he was discussion about starting Humira for her Crohn's disease she has not yet started this today these confounding issues. She presents for followup evaluation.   Past Medical History  Diagnosis Date  . Chest pain 12/10/11    stess test-post stress EF 85%- Exercise capacity is normal. Normal myocardial perfusion study. Low risk scan. No study for comparison.  . Palpitations 12/22/11  . Hyperlipidemia   . Sickle cell trait   . Hypertension 12/10/11    Echo- normal dystolic function grade 1 diastolic dysfuction. Mitral Valve E to A ratio is 0.65. evidence for mild MR and mild TR without other significant abnormalities.  . Crohn's disease   . Carotid bruit 05/09/08    carotid doppler-normal    History reviewed. No pertinent past surgical history.  Allergies  Allergen Reactions  . Imuran [Azathioprine]     pancreatitis  . Lialda [Mesalamine]   . Septra [  Sulfamethoxazole-Tmp Ds]     Current Outpatient Prescriptions  Medication Sig Dispense Refill  . ALPRAZolam (XANAX) 0.5 MG tablet Take 0.5 mg by mouth as needed.      . budesonide (ENTOCORT EC) 3 MG 24 hr capsule Take 9 mg by mouth every morning.      . clidinium-chlordiazePOXIDE (LIBRAX) 2.5-5 MG per  capsule Take 1 capsule by mouth as needed.      . hyoscyamine (LEVBID) 0.375 MG 12 hr tablet Take 0.375 mg by mouth every 12 (twelve) hours as needed.       Marland Kitchen losartan-hydrochlorothiazide (HYZAAR) 50-12.5 MG per tablet Take 1 tablet by mouth daily.      . traMADol (ULTRAM) 50 MG tablet Take 50 mg by mouth every 6 (six) hours as needed for pain.      . simvastatin (ZOCOR) 20 MG tablet Take 1 tablet (20 mg total) by mouth every evening.  90 tablet  3   No current facility-administered medications for this visit.    Socially she is married and has one child. She is trying to increase her exercise level. There is no recent tobacco use. There is no alcohol.  ROS is negative for fevers, chills or night sweats. She denies palpitations. She does admit to shortness of breath with walking. At times again she has noticed a sharp chest pain which is nonexertional but other times she does note a squeezing-type sensation. She denies bleeding. She denies recent exacerbation of her Crohn's disease. There has been discussion about use of Humira for her Crohn's disease. She denies edema  Other system review is negative.  PE BP 112/82  Pulse 74  Ht 5' 3"  (1.6 m)  Wt 178 lb 8 oz (80.967 kg)  BMI 31.63 kg/m2  General: Alert, oriented, no distress.  Skin: normal turgor, no rashes HEENT: Normocephalic, atraumatic. Pupils round and reactive; sclera anicteric;no lid lag.  Nose without nasal septal hypertrophy Mouth/Parynx benign; Mallinpatti scale 2 Neck: No JVD, no carotid briuts Lungs: clear to ausculatation and percussion; no wheezing or rales Heart: RRR, s1 s2 normal 1/6 systolic murmur. Her chest wall is nontender. Abdomen: soft, nontender; no hepatosplenomehaly, BS+; abdominal aorta nontender and not dilated by palpation. Pulses 2+ Extremities: no clubbing cyanosis or edema, Homan's sign negative  Neurologic: grossly nonfocal   LABS:  BMET    Component Value Date/Time   NA 138 08/18/2012 1129      Hepatic Function Panel     Component Value Date/Time   PROT 7.2 08/18/2012 1129     CBC    Component Value Date/Time   WBC 11.5* 08/18/2012 1129     BNP No results found for this basename: probnp    Lipid Panel  No results found for this basename: chol,  trig,  hdl,  cholhdl,  vldl,  ldlcalc     RADIOLOGY: No results found.    ASSESSMENT AND PLAN: Mrs.  Khaleelah Yowell has a very strong family history for premature coronary artery disease and has cardiac risk factors also notable for hypertension, mild hyperlipidemia, as well as remote tobacco use. She previously had undergone a nuclear perfusion scan in October 2013 which showed normal perfusion. She has continued to experience shortness of breath particularly with exertion. A recent chest CT done at [facility] apparently was negative. Her cardiopulmonary met test does show impaired maximum oxygen consumption with reduced anaerobic threshold. Is felt most likely her findings were consistent with her documented diastolic dysfunction noted on echocardiography. She is on losartan  which should be beneficial. She takes HCT with the losartan which has resolved her previous edema. Presently, I'm electing to start her on vitamin D 2000 units are slightly low vitamin D level. I reviewed her MR lipoprotein with a very strong family history in her insulin resistance suggestive of metabolic syndrome and electing to add simvastatin 20 mg. She is willing to try a statin. Remotely in the past she had been unsteady and but discontinued this on her own. In 3 months we will repeat an NMR profile in addition to a CMET and I will see her back in the office in 4 months for followup evaluation.    Troy Sine, MD, [facility]  09/27/2012 12:45 PM

## 2012-10-22 ENCOUNTER — Ambulatory Visit: Payer: BC Managed Care – PPO | Admitting: Cardiovascular Disease

## 2012-12-16 ENCOUNTER — Other Ambulatory Visit: Payer: Self-pay

## 2013-01-14 ENCOUNTER — Other Ambulatory Visit: Payer: Self-pay | Admitting: Cardiovascular Disease

## 2013-01-14 LAB — COMPREHENSIVE METABOLIC PANEL
AST: 18 U/L (ref 0–37)
Albumin: 4.1 g/dL (ref 3.5–5.2)
Alkaline Phosphatase: 109 U/L (ref 39–117)
BUN: 11 mg/dL (ref 6–23)
Calcium: 9.9 mg/dL (ref 8.4–10.5)
Chloride: 101 mEq/L (ref 96–112)
Glucose, Bld: 104 mg/dL — ABNORMAL HIGH (ref 70–99)
Potassium: 4.5 mEq/L (ref 3.5–5.3)
Sodium: 138 mEq/L (ref 135–145)
Total Protein: 7.6 g/dL (ref 6.0–8.3)

## 2013-01-17 LAB — NMR LIPOPROFILE WITH LIPIDS
HDL Particle Number: 33 umol/L (ref >=30.5)
HDL Size: 8.6 nm — ABNORMAL LOW (ref >=9.2)
LDL (calc): 90 mg/dL (ref <100)
LDL Size: 21 nm (ref >20.5)
Large HDL-P: 2.5 umol/L — ABNORMAL LOW (ref >=4.8)
Small LDL Particle Number: 265 nmol/L (ref <=527)

## 2013-01-20 ENCOUNTER — Encounter: Payer: Self-pay | Admitting: Cardiovascular Disease

## 2013-01-20 ENCOUNTER — Ambulatory Visit (INDEPENDENT_AMBULATORY_CARE_PROVIDER_SITE_OTHER): Payer: BC Managed Care – PPO | Admitting: Cardiovascular Disease

## 2013-01-20 VITALS — BP 110/80 | HR 95 | Ht 61.0 in | Wt 181.7 lb

## 2013-01-20 DIAGNOSIS — H02401 Unspecified ptosis of right eyelid: Secondary | ICD-10-CM

## 2013-01-20 DIAGNOSIS — H02409 Unspecified ptosis of unspecified eyelid: Secondary | ICD-10-CM

## 2013-01-20 DIAGNOSIS — R079 Chest pain, unspecified: Secondary | ICD-10-CM

## 2013-01-20 DIAGNOSIS — I10 Essential (primary) hypertension: Secondary | ICD-10-CM

## 2013-01-20 DIAGNOSIS — R0602 Shortness of breath: Secondary | ICD-10-CM

## 2013-01-20 DIAGNOSIS — I519 Heart disease, unspecified: Secondary | ICD-10-CM

## 2013-01-20 DIAGNOSIS — D573 Sickle-cell trait: Secondary | ICD-10-CM

## 2013-01-20 DIAGNOSIS — I5189 Other ill-defined heart diseases: Secondary | ICD-10-CM

## 2013-01-20 DIAGNOSIS — Z8249 Family history of ischemic heart disease and other diseases of the circulatory system: Secondary | ICD-10-CM

## 2013-01-20 NOTE — Patient Instructions (Signed)
Your physician has recommended you make the following change in your medication: add CoQ 10 300 mg daily.  Your physician recommends that you schedule a follow-up appointment in: 6 MONTHS.

## 2013-01-20 NOTE — Progress Notes (Signed)
Patient ID: Joan Richardson, female   DOB: [birth year], 47 y.o.   MRN: [id number]     HPI: Joan Richardson, is a 47 y.o. female who presents to the office today for 4 month cardiology followup evaluation.    Joan Richardson has a strong family history for premature coronary artery disease in multiple family members with several uncles having suffered myocardial infarctions in their late 69s and 96s. Her mother recently underwent stenting several months ago. The patient has experienced episodes of chest pain which at times are described as sharp and other times as dull. At times there is left arm and neck radiation. She does note shortness of breath with walking. In October 2013 a nuclear perfusion study was normal. .  There is remote tobacco history having started at age 22 but smoked more consistently at age 49. She quit smoking in 1994. She also has a history of hypertension, as well as mild hyperlipidemia. There is a history of Crohn's disease. 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 She's unaware of any African American heritage but she does admit to [place] remotely.  In October 2013 an echo Doppler study showed normal systolic function but grade 1 diastolic dysfunction. She had mild MR and mild TR.  Recent blood work has shown a total cholesterol 176 LDL calculated 108 LDL particle #1313 HDL particle number low at 29 and an increased insulin resistance scored 60.  On 09/13/2012 she underwent a cardiopulmonary met test. This revealed reduced functional status with maximum oxygen consumption of only 62% of predicted. Her cardiovascular response was impaired that study was considered indeterminate for myocardial dysfunction due to suboptimal peak cardiovascular stress load. She did have a low anaerobic threshold again suggestive of impaired peak cardiac function. There also was evidence for ventilation/perfusion mismatch and possible increased her PFTs revealed normal FEV1 and FEV1/VC. DLCO was  normal.  On prior echo, Joan Richardson did have normal systolic function with an ejection fraction greater than 55% and a grade 1 diastolic dysfunction the mitral valve E/A ratio of 0.65.  Prior to her last office visit she developed episodes of chest pain leading to an evaluation at [facility] in [place]. She apparently had a normal chest CT. Cardiac enzymes were negative. Her chest pain was felt most likely non-cardiac. In addition, she tells me she has been followed by hematologist/oncologist for several months of low-grade fever and with the axillary lymph node. She has been anemic. Although the past he was discussion about starting Humira for her Crohn's disease she has not yet started this today these confounding issues.    I recommended that she try to reinstitute statin therapy in light of her strong family history for coronary obstructive disease. She was started on simvastatin 20 mg. Subsequent blood work done on 01/14/2013 reveals improvement in her LDL particle member at 1243, down from 1313 and her LDL was reduced from 108-90. Total cholesterol is improved from 170 07/12/1947. HDL particle numbers improved at 33. There still is some resistance was in insulin resistance scored 65. A Cmet was essentially normal with the exception of mild increased glucose at 104.  Joan Richardson states that she recently had continued to experience some shortness of breath with activity. She also notices some occasional right eye droop and also some occasional numbness in the right side of her head and face. She also tells me she is now seeing an oncologist Dr. Marilynn Rail at [facility] been found to have sickle cell  trait.  Past Medical History  Diagnosis Date  . Chest pain 12/10/11    stess test-post stress EF 85%- Exercise capacity is normal. Normal myocardial perfusion study. Low risk scan. No study for comparison.  . Palpitations 12/22/11  . Hyperlipidemia   . Sickle cell trait   . Hypertension  12/10/11    Echo- normal dystolic function grade 1 diastolic dysfuction. Mitral Valve E to A ratio is 0.65. evidence for mild MR and mild TR without other significant abnormalities.  . Crohn's disease   . Carotid bruit 05/09/08    carotid doppler-normal    History reviewed. No pertinent past surgical history.  Allergies  Allergen Reactions  . Imuran [Azathioprine]     pancreatitis  . Lialda [Mesalamine]   . Septra [Sulfamethoxazole-Tmp Ds]     Current Outpatient Prescriptions  Medication Sig Dispense Refill  . ALPRAZolam (XANAX) 0.5 MG tablet Take 0.5 mg by mouth as needed.      . clidinium-chlordiazePOXIDE (LIBRAX) 2.5-5 MG per capsule Take 1 capsule by mouth as needed.      . hyoscyamine (LEVBID) 0.375 MG 12 hr tablet Take 0.375 mg by mouth every 12 (twelve) hours as needed.       Marland Kitchen losartan-hydrochlorothiazide (HYZAAR) 50-12.5 MG per tablet Take 1 tablet by mouth daily.      . simvastatin (ZOCOR) 20 MG tablet Take 1 tablet (20 mg total) by mouth every evening.  90 tablet  3  . traMADol (ULTRAM) 50 MG tablet Take 50 mg by mouth every 6 (six) hours as needed for pain.      . pantoprazole (PROTONIX) 40 MG tablet Take 1 tablet by mouth daily.       No current facility-administered medications for this visit.    Socially she is married and has one child. She is trying to increase her exercise level. There is no recent tobacco use. There is no alcohol.  ROS is negative for fevers, chills or night sweats. She denies headache. However, she does admit to occasional right eye droop so some transient numbness in the right side of her head and face. She denies changes in hearing. She denies palpitations. She does admit to shortness of breath with walking. At times again she has noticed a sharp chest pain which is nonexertional but other times she does note a squeezing-type sensation. She denies abdominal pain. There is no nausea or vomiting. She denies awareness of blood in her stool or urine.  She does note some occasional aches since taking statin therapy but otherwise tolerates this relatively well. She denies edema. She is unaware of overt diabetes but is insulin resistant. There is no thyroid history. She denies bleeding. She denies recent exacerbation of her Crohn's disease. There has been discussion about use of Humira for her Crohn's disease. She denies edema  Other comprehensive 14 point system review is negative.  PE BP 110/80  Pulse 95  Ht 5' 1"  (1.549 m)  Wt 181 lb 11.2 oz (82.419 kg)  BMI 34.35 kg/m2  General: Alert, oriented, no distress.  Skin: normal turgor, no rashes HEENT: Normocephalic, atraumatic. Pupils round and reactive; sclera anicteric;no lid lag.  Nose without nasal septal hypertrophy Mouth/Parynx benign; Mallinpatti scale 2 Neck: No JVD, no carotid briuts Back: No CVA tenderness. Chest: No chest wall tenderness to palpation. Lungs: clear to ausculatation and percussion; no wheezing or rales Heart: RRR, s1 s2 normal 1/6 systolic murmur. Her chest wall is nontender. Abdomen: soft, nontender; no hepatosplenomehaly, BS+; abdominal aorta nontender and  not dilated by palpation. Pulses 2+ Extremities: no clubbing cyanosis or edema, Homan's sign negative  Neurologic: grossly nonfocal Psychologic: Normal affect and mood. Normal cognition.   ECG normal sinus rhythm at 95 beats per minute. Nonspecific ST changes.  LABS:  BMET    Component Value Date/Time   NA 138 01/14/2013 0922     Hepatic Function Panel     Component Value Date/Time   PROT 7.6 01/14/2013 0922     CBC    Component Value Date/Time   WBC 11.5* 08/18/2012 1129     BNP No results found for this basename: probnp    Lipid Panel  No results found for this basename: chol,  trig,  hdl,  cholhdl,  vldl,  ldlcalc     RADIOLOGY: No results found.    ASSESSMENT AND PLAN: Mrs.  Joan Richardson has a very strong family history for premature coronary artery disease and has cardiac  risk factors also notable for hypertension, mild hyperlipidemia, as well as remote tobacco use. She previously had undergone a nuclear perfusion scan in October 2013 which showed normal perfusion. She has continued to experience shortness of breath particularly with exertion. A  chest CT in August 2014  was negative. Her cardiopulmonary met test has  shown impaired maximum oxygen consumption with reduced anaerobic threshold. Is felt most likely her findings were consistent with her documented diastolic dysfunction noted on echocardiography. She is on losartan which should be beneficial. She takes HCT with the losartan which has resolved her previous edema. Presently, I'm electing to start her on vitamin D 2000 units are slightly low vitamin D level. I reviewed her most recent  NMR lipoprofile with a very strong family history and her insulin resistance suggestive of metabolic syndrome which is improved since adding  simvastatin 20 mg. I have suggested that she had coenzyme Q10 300 mg to her medical regimen which may further improve her tolerance to statin therapy. I also had a long discussion with her concerning her recent intermittent right eye droop with occasional numbness on the right side of her head and face. I have suggested she consider neurologic evaluation to make certain this is not representative of myasthenia gravis. Her blood pressure remains well-controlled on angiotensin receptor blocker therapy combined with HCT. I will see her in 6 months for cardiology evaluation.   Troy Sine, MD, [facility]  01/20/2013 6:59 PM

## 2013-02-04 DIAGNOSIS — H02409 Unspecified ptosis of unspecified eyelid: Secondary | ICD-10-CM | POA: Insufficient documentation

## 2013-02-04 DIAGNOSIS — D573 Sickle-cell trait: Secondary | ICD-10-CM | POA: Insufficient documentation

## 2013-04-22 ENCOUNTER — Telehealth: Payer: Self-pay | Admitting: Cardiovascular Disease

## 2013-04-22 NOTE — Telephone Encounter (Signed)
SPOKE TO PATIENT. SHE STATES SHE HAS BEEN HAVING CHEST DISCOMFORT FOR THE PAST WEEK . SHE ESPECIALLY HAD DISCOMFORT YESTERDAY MID STERNAL RADIATING THROUGH TO HER BACK AND RADIADIATING DOWN HER ARM. SHE STATES SHE WENT TO WORK TODAY HAD THE DISCOMFORT AGAIN TODAY , HER HUSBAND BROUGHT SOME ASPIRIN . SHE TOOK ONE TABLET, IT RELIEVED.   RN ASKED IF PATIENT USED NTG. PATIENT STATES SHE DOES NOT HAVE ANY. RN STRONGLY RECOMMEND FOR PATIENT CALL 911 GO TO [place].SHE STATES SHE WANTS HER HUSBAND TO DRIVE HER ,BECAUSE SHE STATES SHE IS  NOT HAVING PROBLEM.  RN INFORMED PATIENT TO CALL 911 AGAIN- SHE ASKED CARDIOLOGIST ON CALL. RN STATED THAT THERE IS CARDIOLOGIST -ON CALL FOR [facility] --- ER PHYSICIAN WILL EVALUATE  FIRST ,IF CARDIOLOGYIS NEEDED -[facility] WILL BE CALL.  PATIENT VERBALIZED UNDERSTANDING.  NOTIFIED [place] -([place])

## 2013-05-12 ENCOUNTER — Telehealth: Payer: Self-pay | Admitting: *Deleted

## 2013-05-12 NOTE — Telephone Encounter (Signed)
Returned call and pt verified x 2.  Pt stated Dr. Claiborne Billings has her on simvastatin and told her if she continued to have aches he would switch her to Zetia.  RN reviewed last OV note and asked pt if she has been taking Co Q 10 as directed.  Pt stated she has never heard of it.  Stated she doesn't remember Dr. Claiborne Billings saying anything about that.  Pt informed it was printed on her discharge papers and documented in her note.  Informed the note does NOT state anything about switching simvastatin to Zetia and also that Zetia would supplement simvastatin, but not replace it.  Pt then stated she doesn't remember what he said he would switch her to, but she is NOT taking simvastatin and refuses to take it.  Pt informed Dr. Claiborne Billings will be notified and advised she follow a strict low fat diet and exercise since she will not be on any medication for her cholesterol while waiting.  Pt verbalized understanding and agreed w/ plan.  Message forwarded to Dr. Claiborne Billings.   Pt refuses to take simvastatin  Wants changed to Zetia or other med discussed at OV  Pt not taking Co Q 10 as advised at last OV

## 2013-05-12 NOTE — Telephone Encounter (Signed)
Pt was calling in regards to her Simvastatin. She would like to change it because she is having issues with it. She wants to switch to Zetia.   TK

## 2013-05-13 MED ORDER — EZETIMIBE 10 MG PO TABS: 10.0000 mg | ORAL_TABLET | Freq: Every day | ORAL | Status: DC

## 2013-05-13 NOTE — Telephone Encounter (Signed)
Returned call and informed pt per instructions by MD.  Pt verbalized understanding and agreed w/ plan.    Discount card left at front desk for pick up.

## 2013-05-13 NOTE — Telephone Encounter (Signed)
Dc simvastatin, try zetia 10 mg

## 2013-08-15 ENCOUNTER — Other Ambulatory Visit: Payer: Self-pay | Admitting: *Deleted

## 2013-08-15 MED ORDER — EZETIMIBE 10 MG PO TABS: 10.0000 mg | ORAL_TABLET | Freq: Every day | ORAL | Status: DC

## 2014-03-19 ENCOUNTER — Other Ambulatory Visit: Payer: Self-pay | Admitting: Cardiovascular Disease

## 2014-03-20 NOTE — Telephone Encounter (Signed)
Rx(s) sent to pharmacy electronically. Message sent to [facility], Dr. Evette Georges scheduler, to contact patient for OV

## 2014-03-21 ENCOUNTER — Telehealth: Payer: Self-pay | Admitting: Cardiovascular Disease

## 2014-03-22 NOTE — Telephone Encounter (Signed)
Closed enocunter

## 2014-05-23 ENCOUNTER — Encounter: Payer: Self-pay | Admitting: Cardiovascular Disease

## 2014-05-23 ENCOUNTER — Ambulatory Visit (INDEPENDENT_AMBULATORY_CARE_PROVIDER_SITE_OTHER): Payer: BLUE CROSS/BLUE SHIELD | Admitting: Cardiovascular Disease

## 2014-05-23 VITALS — BP 112/72 | HR 87 | Ht 60.0 in | Wt 176.2 lb

## 2014-05-23 DIAGNOSIS — R0789 Other chest pain: Secondary | ICD-10-CM

## 2014-05-23 DIAGNOSIS — Z8249 Family history of ischemic heart disease and other diseases of the circulatory system: Secondary | ICD-10-CM

## 2014-05-23 DIAGNOSIS — I10 Essential (primary) hypertension: Secondary | ICD-10-CM | POA: Diagnosis not present

## 2014-05-23 DIAGNOSIS — E785 Hyperlipidemia, unspecified: Secondary | ICD-10-CM

## 2014-05-23 DIAGNOSIS — I519 Heart disease, unspecified: Secondary | ICD-10-CM | POA: Diagnosis not present

## 2014-05-23 DIAGNOSIS — I5189 Other ill-defined heart diseases: Secondary | ICD-10-CM

## 2014-05-23 DIAGNOSIS — R0602 Shortness of breath: Secondary | ICD-10-CM

## 2014-05-23 DIAGNOSIS — K5090 Crohn's disease, unspecified, without complications: Secondary | ICD-10-CM | POA: Diagnosis not present

## 2014-05-23 NOTE — Patient Instructions (Signed)
Your physician wants you to follow-up in: 1 year or sooner if needed. No changes was made today in your therapy. You will receive a reminder letter in the mail two months in advance. If you don't receive a letter, please call our office to schedule the follow-up appointment.

## 2014-05-25 ENCOUNTER — Encounter: Payer: Self-pay | Admitting: Cardiovascular Disease

## 2014-05-25 DIAGNOSIS — E785 Hyperlipidemia, unspecified: Secondary | ICD-10-CM | POA: Insufficient documentation

## 2014-05-25 NOTE — Progress Notes (Signed)
Patient ID: MELL MELLOTT, female   DOB: 1965/07/02, 49 y.o.   MRN: [id number]     HPI: Joan Richardson is a 49 y.o. female who presents to the office today for 42 month cardiology followup evaluation.    Joan Richardson has a strong family history for premature CAD in multiple family members with several uncles having suffered myocardial infarctions in their late 70s and 42s. Her mother underwent stenting in 2014. The patient has experienced episodes of chest pain which at times are described as sharp and other times as dull. At times there is left arm and neck radiation. She does note shortness of breath with walking. In October 2013 a nuclear perfusion study was normal. .  There is remote tobacco history having started at age 42 but smoked more consistently at age 37. She quit smoking in 1994. She also has a history of hypertension, as well as mild hyperlipidemia. There is a history of Crohn's disease. 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 She's unaware of any African American heritage but she does admit to [place] remotely.  In October 2013 an echo Doppler study showed normal systolic function but grade 1 diastolic dysfunction. She had mild MR and mild TR.  On 09/13/2012 she underwent a cardiopulmonary met test. This revealed reduced functional status with maximum oxygen consumption of only 62% of predicted. Her cardiovascular response was impaired that study was considered indeterminate for myocardial dysfunction due to suboptimal peak cardiovascular stress load. She did have a low anaerobic threshold again suggestive of impaired peak cardiac function. There also was evidence for ventilation/perfusion mismatch and possible increased her PFTs revealed normal FEV1 and FEV1/VC. DLCO was normal.  On prior echo, Joan Richardson did have normal systolic function with an ejection fraction greater than 55% and a grade 1 diastolic dysfunction the mitral valve E/A ratio of 0.65.  Prior to her last office visit  she developed episodes of chest pain leading to an evaluation at [facility] in [place]. She apparently had a normal chest CT. Cardiac enzymes were negative. Her chest pain was felt most likely non-cardiac. In addition, she tells me she has been followed by hematologist/oncologist for several months of low-grade fever and with the axillary lymph node. She has been anemic.    I recommended that she try to reinstitute statin therapy in light of her strong family history for coronary obstructive disease. She was started on simvastatin 20 mg. Subsequent blood work on 01/14/2013 reveals improvement in her LDL particle member at 1243, down from 1313 and her LDL was reduced from 108-90. Total cholesterol is improved from 170 07/12/1947. HDL particle numbers improved at 33. There still is some resistance was in insulin resistance scored 65. A Cmet was essentially normal with the exception of mild increased glucose at 104.  Joan Richardson states that she recently had continued to experience some shortness of breath with activity. She also notices some occasional right eye droop and also some occasional numbness in the right side of her head and face. She also tells me she is now seeing an oncologist Dr. Marilynn Rail at [facility] been found to have sickle cell trait.  She is now persistent pain in in a clinical trial for her Crohn's disease with a kinase inhibitor and is enrolled in the [id number] study at [facility].  Since I last saw her, she stopped taking the simvastatin due to myalgias.  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  She is on Zetia for hyperlipidemia.  She walks daily but notes some mild shortness of breath.  She continues to experience some transient numbness of her hands when she sleeps, occasional facial numbness, and notes vague intermittent jaw discomfort.  Past Medical History  Diagnosis Date  . Chest  pain 12/10/11    stess test-post stress EF 85%- Exercise capacity is normal. Normal myocardial perfusion study. Low risk scan. No study for comparison.  . Palpitations 12/22/11  . Hyperlipidemia   . Sickle cell trait   . Hypertension 12/10/11    Echo- normal dystolic function grade 1 diastolic dysfuction. Mitral Valve E to A ratio is 0.65. evidence for mild MR and mild TR without other significant abnormalities.  . Crohn's disease   . Carotid bruit 05/09/08    carotid doppler-normal    History reviewed. No pertinent past surgical history.  Allergies  Allergen Reactions  . Imuran [Azathioprine]     pancreatitis  . Lialda [Mesalamine]   . Septra [Sulfamethoxazole-Trimethoprim]     Current Outpatient Prescriptions  Medication Sig Dispense Refill  . ALPRAZolam (XANAX) 0.5 MG tablet Take 0.5 mg by mouth as needed.    . baclofen (LIORESAL) 10 MG tablet Take 10 mg by mouth as needed for muscle spasms.    . Cholecalciferol (VITAMIN D3) 2000 UNITS TABS Take 1 tablet by mouth daily.    . clidinium-chlordiazePOXIDE (LIBRAX) 2.5-5 MG per capsule Take 1 capsule by mouth as needed.    . Cyanocobalamin (VITAMIN B-12 SL) Place 2,500 mcg under the tongue daily.    . diazepam (DIASTAT) 2.5 MG GEL Place 2.5 mg rectally as needed for seizure.    . ezetimibe (ZETIA) 10 MG tablet Take 1 tablet (10 mg total) by mouth daily. <please make appointment for refills> 30 tablet 0  . hyoscyamine (LEVBID) 0.375 MG 12 hr tablet Take 0.375 mg by mouth every 12 (twelve) hours as needed.     Marland Kitchen losartan-hydrochlorothiazide (HYZAAR) 50-12.5 MG per tablet Take 1 tablet by mouth daily.    . pantoprazole (PROTONIX) 40 MG tablet Take 1 tablet by mouth daily.    . simvastatin (ZOCOR) 20 MG tablet Take 1 tablet (20 mg total) by mouth every evening. 90 tablet 3  . traMADol (ULTRAM) 50 MG tablet Take 50 mg by mouth every 6 (six) hours as needed for pain.     No current facility-administered medications for this visit.     Socially she is married and has one child. She is trying to increase her exercise level. There is no recent tobacco use. There is no alcohol.  ROS General: Negative; No fevers, chills, or night sweats;  HEENT: Negative; No changes in vision or hearing, sinus congestion, difficulty swallowing Pulmonary: Negative; No cough, wheezing, shortness of breath, hemoptysis Cardiovascular: Negative; No chest pain, presyncope, syncope, palpitations GI: History of Crohn's disease currently involved in a study at [facility] GU: Negative; No dysuria, hematuria, or difficulty voiding Musculoskeletal: Negative; no myalgias, joint pain, or weakness Hematologic/Oncology: Negative; no easy bruising, bleeding Endocrine: Negative; no heat/cold intolerance; no diabetes Neuro: Positive for hand numbness when she sleeps and occasional facial numbness Skin: Negative; No rashes or skin lesions Psychiatric: Negative; No behavioral problems, depression Sleep: Negative; No snoring, daytime sleepiness, hypersomnolence, bruxism, restless legs, hypnogognic hallucinations, no cataplexy Other comprehensive 14 point system review is negative.   PE BP 112/72 mmHg  Pulse 87  Ht 5' (1.524 m)  [facility]  176 lb 3.2 oz (79.924 kg)  BMI 34.41 kg/m2  General: Alert, oriented, no distress.  Skin: normal turgor, no rashes HEENT: Normocephalic, atraumatic. Pupils round and reactive; sclera anicteric;no lid lag.  Nose without nasal septal hypertrophy Mouth/Parynx benign; Mallinpatti scale 2 Neck: No JVD, no carotid bruits Back: No CVA tenderness. Chest: No chest wall tenderness to palpation. Lungs: clear to ausculatation and percussion; no wheezing or rales Heart: RRR, s1 s2 normal 1/6 systolic murmur.  No diastolic murmur.  No rubs thrills or heaves. Abdomen: soft, nontender; no hepatosplenomehaly, BS+; abdominal aorta nontender and not dilated by palpation. Back: No CVA tenderness Pulses 2+ Extremities: no clubbing  cyanosis or edema, Homan's sign negative  Neurologic: grossly nonfocal Psychologic: Normal affect and mood. Normal cognition.   ECG (independently read by me): Normal sinus rhythm at 87 beats per minute.  QTc interval 440 ms.  ECG normal sinus rhythm at 95 beats per minute. Nonspecific ST changes.  LABS:  BMET  BMP Latest Ref Rng 01/14/2013 08/18/2012  Glucose 70 - 99 mg/dL 104(H) 102(H)  BUN 6 - 23 mg/dL 11 10  Creatinine 0.50 - 1.10 mg/dL 0.67 0.71  Sodium 135 - 145 mEq/L 138 138  Potassium 3.5 - 5.3 mEq/L 4.5 4.6  Chloride 96 - 112 mEq/L 101 99  CO2 19 - 32 mEq/L 27 24  Calcium 8.4 - 10.5 mg/dL 9.9 9.3     Hepatic Function Latest Ref Rng 01/14/2013 08/18/2012  Total Protein 6.0 - 8.3 g/dL 7.6 7.2  Albumin 3.5 - 5.2 g/dL 4.1 4.4  AST 0 - 37 U/L 18 23  ALT 0 - 35 U/L 12 19  Alk Phosphatase 39 - 117 U/L 109 123(H)  Total Bilirubin 0.3 - 1.2 mg/dL 0.6 0.5     CBC CBC Latest Ref Rng 08/18/2012  WBC 4.0 - 10.5 K/uL 11.5(H)  Hemoglobin 12.0 - 15.0 g/dL 12.9  Hematocrit 36.0 - 46.0 % 39.1  Platelets 150 - 400 K/uL 389      BNP No results found for: PROBNP    Lipid Panel     Component Value Date/Time   CHOL 149 01/14/2013 0922   TRIG 73 01/14/2013 0922   HDL 44 01/14/2013 0922   LDLCALC 90 01/14/2013 0922     RADIOLOGY: No results found.    ASSESSMENT AND PLAN: Mrs.  Isabellah Richardson has a very strong family history for premature coronary artery disease and has cardiac risk factors  notable for hypertension, mild hyperlipidemia, as well as remote tobacco use. She previously had undergone a nuclear perfusion scan in October 2013 which showed normal perfusion. She has continued to experience shortness of breath particularly with exertion. A  chest CT in August 2014  was negative. A cardiopulmonary met test has  shown impaired maximum oxygen consumption with reduced anaerobic threshold. It was felt most likely her findings were consistent with her documented diastolic  dysfunction noted on echocardiography.  Her blood pressure is controlled today on her current dose of losartan HCT 50/12.5, which will also be 962 diastolic dysfunction.  She is now taking only Zetia 10 mg for hyperlipidemia and did not tolerate simvastatin despite taking coenzyme Q10 form with myalgias.  She is participating in a clinical trial for her Crohn's disease and she never did take Humira.  She brought with her laboratory which was done in March 2016.  I personally reviewed this.  Renal function was normal with a creatinine of 0.72.  Total cholesterol 159, triglycerides 75, HDL 67, LDL cholesterol  77.  TSH was normal at 3.5.  She had normal liver function studies.  Anemic with hemoglobin of 12.2, hematocrit 37.4.  MCV 93.  His laboratory was done by Janett Labella, RN, FNP in [place], [place].  I have suggested that if she continues to experience a numbness of her face and hands.  Consider neurologic evaluation.  Clinically she is stable from a cardiovascular standpoint.  I will see her in one year for reevaluation or sooner as needed.  Troy Sine, MD, [facility]  05/25/2014 5:20 PM

## 2014-05-30 ENCOUNTER — Encounter: Payer: Self-pay | Admitting: Cardiovascular Disease

## 2014-06-20 ENCOUNTER — Other Ambulatory Visit: Payer: Self-pay | Admitting: Cardiovascular Disease

## 2014-06-21 NOTE — Telephone Encounter (Signed)
Rx(s) sent to pharmacy electronically.  

## 2015-06-22 ENCOUNTER — Other Ambulatory Visit: Payer: Self-pay | Admitting: Cardiovascular Disease

## 2015-07-03 DIAGNOSIS — R1031 Right lower quadrant pain: Secondary | ICD-10-CM | POA: Diagnosis not present

## 2015-07-03 DIAGNOSIS — N6002 Solitary cyst of left breast: Secondary | ICD-10-CM | POA: Diagnosis not present

## 2015-07-03 DIAGNOSIS — C50911 Malignant neoplasm of unspecified site of right female breast: Secondary | ICD-10-CM | POA: Diagnosis not present

## 2015-07-03 DIAGNOSIS — Z01411 Encounter for gynecological examination (general) (routine) with abnormal findings: Secondary | ICD-10-CM | POA: Diagnosis not present

## 2015-07-03 DIAGNOSIS — R928 Other abnormal and inconclusive findings on diagnostic imaging of breast: Secondary | ICD-10-CM | POA: Diagnosis not present

## 2015-07-03 DIAGNOSIS — N6001 Solitary cyst of right breast: Secondary | ICD-10-CM | POA: Diagnosis not present

## 2015-07-03 DIAGNOSIS — Z6835 Body mass index (BMI) 35.0-35.9, adult: Secondary | ICD-10-CM | POA: Diagnosis not present

## 2015-07-20 DIAGNOSIS — Z713 Dietary counseling and surveillance: Secondary | ICD-10-CM | POA: Diagnosis not present

## 2015-07-28 DIAGNOSIS — Z87891 Personal history of nicotine dependence: Secondary | ICD-10-CM | POA: Diagnosis not present

## 2015-07-28 DIAGNOSIS — D573 Sickle-cell trait: Secondary | ICD-10-CM | POA: Diagnosis not present

## 2015-07-28 DIAGNOSIS — I10 Essential (primary) hypertension: Secondary | ICD-10-CM | POA: Diagnosis not present

## 2015-07-28 DIAGNOSIS — Z791 Long term (current) use of non-steroidal anti-inflammatories (NSAID): Secondary | ICD-10-CM | POA: Diagnosis not present

## 2015-07-28 DIAGNOSIS — R509 Fever, unspecified: Secondary | ICD-10-CM | POA: Diagnosis not present

## 2015-07-28 DIAGNOSIS — Z888 Allergy status to other drugs, medicaments and biological substances status: Secondary | ICD-10-CM | POA: Diagnosis not present

## 2015-07-28 DIAGNOSIS — R011 Cardiac murmur, unspecified: Secondary | ICD-10-CM | POA: Diagnosis not present

## 2015-07-28 DIAGNOSIS — R000 Tachycardia, unspecified: Secondary | ICD-10-CM | POA: Diagnosis not present

## 2015-07-28 DIAGNOSIS — R079 Chest pain, unspecified: Secondary | ICD-10-CM | POA: Diagnosis not present

## 2015-07-28 DIAGNOSIS — N390 Urinary tract infection, site not specified: Secondary | ICD-10-CM | POA: Diagnosis not present

## 2015-07-28 DIAGNOSIS — K5090 Crohn's disease, unspecified, without complications: Secondary | ICD-10-CM | POA: Diagnosis not present

## 2015-07-28 DIAGNOSIS — Z882 Allergy status to sulfonamides status: Secondary | ICD-10-CM | POA: Diagnosis not present

## 2015-07-30 ENCOUNTER — Encounter: Payer: Self-pay | Admitting: Physician Assistant

## 2015-07-30 ENCOUNTER — Ambulatory Visit (INDEPENDENT_AMBULATORY_CARE_PROVIDER_SITE_OTHER): Payer: BLUE CROSS/BLUE SHIELD | Admitting: Physician Assistant

## 2015-07-30 VITALS — BP 102/70 | HR 90 | Ht 61.0 in | Wt 185.0 lb

## 2015-07-30 DIAGNOSIS — I10 Essential (primary) hypertension: Secondary | ICD-10-CM

## 2015-07-30 DIAGNOSIS — R0602 Shortness of breath: Secondary | ICD-10-CM | POA: Diagnosis not present

## 2015-07-30 DIAGNOSIS — E785 Hyperlipidemia, unspecified: Secondary | ICD-10-CM | POA: Diagnosis not present

## 2015-07-30 DIAGNOSIS — R079 Chest pain, unspecified: Secondary | ICD-10-CM

## 2015-07-30 LAB — HM DIABETES EYE EXAM

## 2015-07-30 MED ORDER — EZETIMIBE 10 MG PO TABS: ORAL_TABLET | ORAL | Status: DC

## 2015-07-30 MED ORDER — LOSARTAN POTASSIUM-HCTZ 50-12.5 MG PO TABS: 1.0000 | ORAL_TABLET | Freq: Every day | ORAL | Status: DC

## 2015-07-30 NOTE — Patient Instructions (Addendum)
Medication Instructions:  A REFILL FOR ZETIA AND HYZAAR HAVE BEEN SENT IN Labwork: BNP TODAY Testing/Procedures: 1. Your physician has requested that you have an echocardiogram. Echocardiography is a painless test that uses sound waves to create images of your heart. It provides your doctor with information about the size and shape of your heart and how well your heart's chambers and valves are working. This procedure takes approximately one hour. There are no restrictions for this procedure. 2. Your physician has requested that you have en exercise stress myoview. For further information please visit HugeFiesta.[email or web address]. Please follow instruction sheet, as given. Follow-Up: DR. Claiborne Billings IN 3-4 WEEKS OR PA/NP SAME DAY DR. Claiborne Billings IS IN THE OFFICE Any Other Special Instructions Will Be Listed Below (If Applicable). If you need a refill on your cardiac medications before your next appointment, please call your pharmacy.

## 2015-07-30 NOTE — Progress Notes (Signed)
Cardiology Office Note:    Date:  07/30/2015   ID:  Laurita Quint, DOB 10/15/1965, MRN [id number]  PCP:  Beatris Si  Cardiologist:  Dr. Shelva Majestic   Electrophysiologist:  n/a  Referring MD: Briscoe Deutscher, MD   Chief Complaint  Patient presents with  . Chest Pain    History of Present Illness:     MAHALEY SCHWERING is a 50 y.o. female with a FHx of CAD, chest pain, HTN, HL, Crohn's disease, sickle cell trait.  Nuclear stress test in 2013 was normal.  Last seen by Dr. Shelva Majestic in 4/16.  She returns for FU. She is losing her insurance in 2 mos.  She is losing her job with [employer] home and auto insurance.  She needs refills.  She is in a study at [facility].  She has been stable with her Crohn's since starting in the study in 2015.  She has a lot of labs drawn at [facility]  LDL in 4/17 at [facility] was 102. She has a long hx of chest pain or pressure.  She has noted that her chest pain is more frequent.  She can experience chest pain with rest or with activity. She can do activities without chest pain as well.  She notes DOE with certain activities.  This also seems to be worse.  However, she can go several days to weeks without symptoms.  She denies syncope. She does get lightheaded at times.  She has slept on 2 pillows for a long time.  She denies PND, edema.      Past Medical History  Diagnosis Date  . Chest pain     Myoview 10/13: Normal perfusion, no ischemia, EF 85%  . Palpitations 12/22/11  . Hyperlipidemia   . Sickle cell trait ([place])   . Hypertension 12/10/11    Echo- normal dystolic function grade 1 diastolic dysfuction. Mitral Valve E to A ratio is 0.65. evidence for mild MR and mild TR without other significant abnormalities.  . Crohn's disease ([place])   . Carotid bruit 05/09/08    carotid doppler-normal  . History of echocardiogram     a. Echo 10/13: Normal LVEF, Gr 1 DD, mild MR, mild TR    History reviewed. No pertinent past surgical history.  Current Medications: Outpatient  Prescriptions Prior to Visit  Medication Sig Dispense Refill  . ALPRAZolam (XANAX) 0.5 MG tablet Take 0.5 mg by mouth as needed.    . baclofen (LIORESAL) 10 MG tablet Take 10 mg by mouth as needed for muscle spasms.    . Cholecalciferol (VITAMIN D3) 2000 UNITS TABS Take 1 tablet by mouth daily.    . clidinium-chlordiazePOXIDE (LIBRAX) 2.5-5 MG per capsule Take 1 capsule by mouth daily as needed (FOR SPASMS).     . Cyanocobalamin (VITAMIN B-12 SL) Place 2,500 mcg under the tongue daily.    . diazepam (DIASTAT) 2.5 MG GEL Place 2.5 mg rectally as needed for seizure.    . hyoscyamine (LEVBID) 0.375 MG 12 hr tablet Take 0.375 mg by mouth every 12 (twelve) hours as needed for cramping.     . pantoprazole (PROTONIX) 40 MG tablet Take 1 tablet by mouth daily as needed (HEARTBURN).     Marland Kitchen simvastatin (ZOCOR) 20 MG tablet Take 1 tablet (20 mg total) by mouth every evening. 90 tablet 3  . traMADol (ULTRAM) 50 MG tablet Take 50 mg by mouth every 6 (six) hours as needed for pain.    Marland Kitchen losartan-hydrochlorothiazide (HYZAAR) 50-12.5 MG per  tablet Take 1 tablet by mouth daily.    Marland Kitchen ZETIA 10 MG tablet TAKE 1 TABLET (10 MG TOTAL) BY MOUTH DAILY. 30 tablet 2   No facility-administered medications prior to visit.      Allergies:   Imuran; Lialda; Septra; Sulfa antibiotics; and Sulfamethoxazole-trimethoprim   Social History   Social History  . Marital Status: Married    Spouse Name: N/A  . Number of Children: N/A  . Years of Education: N/A   Social History Main Topics  . Smoking status: Former Smoker -- 13 years    Types: Cigarettes    Quit date: 05/11/1992  . Smokeless tobacco: Never Used     Comment: April 1994  . Alcohol Use: No  . Drug Use: No  . Sexual Activity: Yes   Other Topics Concern  . None   Social History Narrative     Family History:  The patient's family history includes Arrhythmia in her sister; COPD in her father; Cancer - Other in her paternal grandmother; Emphysema in her  brother; Heart attack (age of onset: 50) in her mother; Hyperlipidemia in her mother; Hypertension in her father; Stroke in her maternal grandfather.   ROS:   Please see the history of present illness.    Review of Systems  Constitution: Positive for chills, fever and malaise/fatigue.  Eyes: Positive for visual disturbance.  Cardiovascular: Positive for chest pain, dyspnea on exertion, irregular heartbeat and leg swelling.  Respiratory: Positive for shortness of breath.   Musculoskeletal: Positive for back pain and joint swelling.  Gastrointestinal: Positive for abdominal pain.  Neurological: Positive for dizziness.  Psychiatric/Behavioral: The patient is nervous/anxious.    All other systems reviewed and are negative.   Physical Exam:    VS:  BP 102/70 mmHg  Pulse 90  Ht 5' 1"  (1.549 m)  Wt 185 lb (83.915 kg)  BMI 34.97 kg/m2   Physical Exam  Constitutional: She is oriented to person, place, and time. She appears well-developed and well-nourished.  HENT:  Head: Normocephalic and atraumatic.  Neck: Normal range of motion. No JVD present. Carotid bruit is not present.  Cardiovascular: Normal rate, regular rhythm and normal heart sounds.   No murmur heard. Pulmonary/Chest: Effort normal and breath sounds normal. She has no wheezes. She has no rales.  Abdominal: Soft. There is no tenderness.  Musculoskeletal: She exhibits no edema.  Neurological: She is alert and oriented to person, place, and time.  Skin: Skin is warm and dry.  Psychiatric: She has a normal mood and affect.    Wt Readings from Last 3 Encounters:  07/30/15 185 lb (83.915 kg)  05/23/14 176 lb 3.2 oz (79.924 kg)  01/20/13 181 lb 11.2 oz (82.419 kg)      Studies/Labs Reviewed:     EKG:  EKG is  ordered today.  The ekg ordered today demonstrates NSR, HR 90, normal axis, nonspecific ST-T wave changes, QTc 428 ms, no change from prior tracing   Recent Labs: No results found for requested labs within last 365  days.  Labs at [facility] 07/28/15: K 4, Cr 0.8  Recent Lipid Panel    Component Value Date/Time   CHOL 149 01/14/2013 0922   TRIG 73 01/14/2013 0922   HDL 44 01/14/2013 0922   LDLCALC 90 01/14/2013 0922    Additional studies/ records that were reviewed today include:   Myoview 10/13 Normal perfusion, no ischemia, EF 85%  Echo 10/13 Normal LVEF, Gr 1 DD, mild MR, mild TR  Carotid US  3/10 No ICA stenosis  ASSESSMENT:     1. Shortness of breath   2. Chest pain, unspecified chest pain type   3. Essential hypertension   4. Hyperlipidemia LDL goal <70     PLAN:     In order of problems listed above:  1. Dyspnea - Etiology not clear.  She thinks that her study drug for Crohn's may cause weight gain.  She does have a hx of diastolic dysfunction on echo.  She does not look volume overloaded.  She has a FHx of CAD and has noted increasing chest pain recently.  ECG is unchanged without ischemic changes.   -  Check BNP today  -  Obtain FU echo  -  Obtain ETT- Myoview  2. Chest pain - She has typical and atypical features.  Her ECG is unchanged.  She has a FHx of CAD and no stress testing in > 3 years.  We could consider Cardiac CT for calcium score or Coronary CTA in the future.  However, for now, I have arranged ETT-Myoview to further evaluate.    3. HTN - BP controlled on current regimen.  Continue Hyzaar at current dose.  4. HL - She is intol of Simvastatin.  Recent LDL was 102 on Zetia.  May need to consider referral to [facility] in the future to see if there is another statin she can tolerate vs PCSK-9 inhibitor Rx.     Medication Adjustments/Labs and Tests Ordered: Current medicines are reviewed at length with the patient today.  Concerns regarding medicines are outlined above.  Medication changes, Labs and Tests ordered today are outlined in the Patient Instructions noted below. Patient Instructions  Medication Instructions:  A REFILL FOR ZETIA AND HYZAAR HAVE BEEN SENT  IN Labwork: BNP TODAY Testing/Procedures: 1. Your physician has requested that you have an echocardiogram. Echocardiography is a painless test that uses sound waves to create images of your heart. It provides your doctor with information about the size and shape of your heart and how well your heart's chambers and valves are working. This procedure takes approximately one hour. There are no restrictions for this procedure. 2. Your physician has requested that you have en exercise stress myoview. For further information please visit HugeFiesta.[email or web address]. Please follow instruction sheet, as given. Follow-Up: DR. Claiborne Billings IN 3-4 WEEKS OR PA/NP SAME DAY DR. Claiborne Billings IS IN THE OFFICE Any Other Special Instructions Will Be Listed Below (If Applicable). If you need a refill on your cardiac medications before your next appointment, please call your pharmacy.   Signed, Richardson Dopp, PA-C  07/30/2015 1:43 PM    [facility] [place], [place], [place]  [address] Phone: [phone]; Fax: [phone]

## 2015-07-31 LAB — BRAIN NATRIURETIC PEPTIDE

## 2015-08-01 ENCOUNTER — Other Ambulatory Visit: Payer: BLUE CROSS/BLUE SHIELD | Admitting: *Deleted

## 2015-08-02 ENCOUNTER — Other Ambulatory Visit: Payer: Self-pay | Admitting: Physician Assistant

## 2015-08-02 DIAGNOSIS — R0602 Shortness of breath: Secondary | ICD-10-CM | POA: Diagnosis not present

## 2015-08-03 LAB — BRAIN NATRIURETIC PEPTIDE

## 2015-08-07 ENCOUNTER — Telehealth: Payer: Self-pay | Admitting: Physician Assistant

## 2015-08-07 ENCOUNTER — Telehealth: Payer: Self-pay | Admitting: *Deleted

## 2015-08-07 NOTE — Telephone Encounter (Signed)
Lmtcb to check on if pt has had lab work done yet. Pt was scheduled here for bmet 6/21, though she s/w keith our lab tech and stated she would like to get lab work over on [address].

## 2015-08-07 NOTE — Telephone Encounter (Signed)
Returning your call .Marland Kitchen Please call   Thanks

## 2015-08-07 NOTE — Telephone Encounter (Signed)
Ptcb and states she had lab work done 08/02/15 @ [facility] in [place] on [address] ph# [phone]. I will call to see about results, since we have not yet received them yet.

## 2015-08-07 NOTE — Telephone Encounter (Signed)
Lmtcb at [facility] to return call in regards to lab work pt had done on 08/02/15.

## 2015-08-09 ENCOUNTER — Encounter: Payer: Self-pay | Admitting: Physician Assistant

## 2015-08-09 NOTE — Telephone Encounter (Signed)
S/w [facility] lab today. BNP results faxed over to [facility] today. BNP is <4.0.  I will lab scanned into pt's chart for provider review.

## 2015-08-13 ENCOUNTER — Telehealth (HOSPITAL_COMMUNITY): Payer: Self-pay | Admitting: *Deleted

## 2015-08-13 ENCOUNTER — Telehealth: Payer: Self-pay | Admitting: *Deleted

## 2015-08-13 NOTE — Telephone Encounter (Signed)
Patient given detailed instructions per Myocardial Perfusion Study Information Sheet for the test on 08/17/15 at 0915. Patient notified to arrive 15 minutes early and that it is imperative to arrive on time for appointment to keep from having the test rescheduled.  If you need to cancel or reschedule your appointment, please call the office within 24 hours of your appointment. Failure to do so may result in a cancellation of your appointment, and a $50 no show fee. Patient verbalized understanding.Rollan Roger, Ranae Palms

## 2015-08-13 NOTE — Telephone Encounter (Signed)
Pt notified of lab results by phone with verbal understanding.  

## 2015-08-17 ENCOUNTER — Other Ambulatory Visit: Payer: Self-pay

## 2015-08-17 ENCOUNTER — Ambulatory Visit (HOSPITAL_BASED_OUTPATIENT_CLINIC_OR_DEPARTMENT_OTHER): Payer: BLUE CROSS/BLUE SHIELD

## 2015-08-17 ENCOUNTER — Ambulatory Visit (HOSPITAL_COMMUNITY): Payer: BLUE CROSS/BLUE SHIELD | Attending: Cardiovascular Disease

## 2015-08-17 ENCOUNTER — Encounter (HOSPITAL_COMMUNITY): Payer: Self-pay

## 2015-08-17 ENCOUNTER — Telehealth: Payer: Self-pay | Admitting: *Deleted

## 2015-08-17 DIAGNOSIS — R079 Chest pain, unspecified: Secondary | ICD-10-CM

## 2015-08-17 DIAGNOSIS — R0602 Shortness of breath: Secondary | ICD-10-CM | POA: Diagnosis not present

## 2015-08-17 DIAGNOSIS — I071 Rheumatic tricuspid insufficiency: Secondary | ICD-10-CM | POA: Insufficient documentation

## 2015-08-17 DIAGNOSIS — Z87891 Personal history of nicotine dependence: Secondary | ICD-10-CM | POA: Diagnosis not present

## 2015-08-17 DIAGNOSIS — E669 Obesity, unspecified: Secondary | ICD-10-CM | POA: Diagnosis not present

## 2015-08-17 DIAGNOSIS — Z6835 Body mass index (BMI) 35.0-35.9, adult: Secondary | ICD-10-CM | POA: Diagnosis not present

## 2015-08-17 DIAGNOSIS — E785 Hyperlipidemia, unspecified: Secondary | ICD-10-CM | POA: Insufficient documentation

## 2015-08-17 DIAGNOSIS — I10 Essential (primary) hypertension: Secondary | ICD-10-CM | POA: Insufficient documentation

## 2015-08-17 LAB — MYOCARDIAL PERFUSION IMAGING
CHL CUP NUCLEAR SSS: 3
CHL CUP RESTING HR STRESS: 71 {beats}/min
CSEPED: 6 min
CSEPEDS: 0 s
CSEPEW: 7 METS
CSEPPHR: 157 {beats}/min
LV dias vol: 55 mL (ref 46–106)
LVSYSVOL: 16 mL
MPHR: 171 {beats}/min
NUC STRESS TID: 0.95
Percent HR: 91 %
RATE: 0.18
SDS: 0
SRS: 3

## 2015-08-17 LAB — ECHOCARDIOGRAM COMPLETE
AVLVOTPG: 3 mmHg
Ao-asc: 33 cm
CHL CUP LV S' LATERAL: 12.9 cm/s
CHL CUP MV DEC (S): 271
E decel time: 271 msec
E/e' ratio: 7.28
FS: 36 % (ref 28–44)
IVS/LV PW RATIO, ED: 1.01
LA ID, A-P, ES: 35 mm
LA diam index: 1.91 cm/m2
LA vol A4C: 26 ml
LA vol: 32 mL
LAVOLIN: 17.5 mL/m2
LDCA: 2.54 cm2
LEFT ATRIUM END SYS DIAM: 35 mm
LV TDI E'LATERAL: 11.8
LV TDI E'MEDIAL: 9.26
LVEEAVG: 7.28
LVEEMED: 7.28
LVELAT: 11.8 cm/s
LVOT SV: 54 mL
LVOT VTI: 21.2 cm
LVOTD: 18 mm
LVOTPV: 91 cm/s
MV Peak grad: 3 mmHg
MV pk E vel: 85.9 m/s
MVPKAVEL: 92.8 m/s
PW: 10.1 mm — AB (ref 0.6–1.1)

## 2015-08-17 MED ORDER — TECHNETIUM TC 99M TETROFOSMIN IV KIT
10.8000 | PACK | Freq: Once | INTRAVENOUS | Status: AC | PRN
  Administered 2015-08-17: 11 via INTRAVENOUS
  Filled 2015-08-17: qty 11

## 2015-08-17 MED ORDER — TECHNETIUM TC 99M TETROFOSMIN IV KIT
32.1000 | PACK | Freq: Once | INTRAVENOUS | Status: AC | PRN
  Administered 2015-08-17: 32.1 via INTRAVENOUS
  Filled 2015-08-17: qty 32

## 2015-08-17 NOTE — Telephone Encounter (Signed)
Pt notified of echo results by phone with verbal understanding. Verified 7/25 appt with Danna Hefty, NP , pt agreeable to plan of care.

## 2015-08-20 ENCOUNTER — Telehealth: Payer: Self-pay | Admitting: *Deleted

## 2015-08-20 ENCOUNTER — Encounter: Payer: Self-pay | Admitting: Physician Assistant

## 2015-08-20 NOTE — Telephone Encounter (Signed)
Pt notified of myoview results by phone with verbal understanding. Pt confirmed appt 7/25 at [facility] office with Danna Hefty, NP, 9 am.

## 2015-08-29 ENCOUNTER — Encounter: Payer: Self-pay | Admitting: Cardiovascular Disease

## 2015-09-04 ENCOUNTER — Ambulatory Visit: Payer: BLUE CROSS/BLUE SHIELD | Admitting: Nurse Practitioner

## 2015-10-22 DIAGNOSIS — J069 Acute upper respiratory infection, unspecified: Secondary | ICD-10-CM | POA: Diagnosis not present

## 2015-10-22 DIAGNOSIS — B9789 Other viral agents as the cause of diseases classified elsewhere: Secondary | ICD-10-CM | POA: Diagnosis not present

## 2015-12-02 DIAGNOSIS — J988 Other specified respiratory disorders: Secondary | ICD-10-CM | POA: Diagnosis not present

## 2015-12-04 DIAGNOSIS — R070 Pain in throat: Secondary | ICD-10-CM | POA: Diagnosis not present

## 2015-12-06 DIAGNOSIS — Z713 Dietary counseling and surveillance: Secondary | ICD-10-CM | POA: Diagnosis not present

## 2015-12-17 ENCOUNTER — Ambulatory Visit: Payer: BLUE CROSS/BLUE SHIELD | Admitting: Cardiovascular Disease

## 2016-01-30 DIAGNOSIS — R6889 Other general symptoms and signs: Secondary | ICD-10-CM | POA: Diagnosis not present

## 2016-02-05 DIAGNOSIS — N6009 Solitary cyst of unspecified breast: Secondary | ICD-10-CM | POA: Diagnosis not present

## 2016-02-05 DIAGNOSIS — N6001 Solitary cyst of right breast: Secondary | ICD-10-CM | POA: Diagnosis not present

## 2016-02-05 DIAGNOSIS — N6002 Solitary cyst of left breast: Secondary | ICD-10-CM | POA: Diagnosis not present

## 2016-03-20 DIAGNOSIS — K5090 Crohn's disease, unspecified, without complications: Secondary | ICD-10-CM | POA: Diagnosis not present

## 2016-03-20 DIAGNOSIS — Z87891 Personal history of nicotine dependence: Secondary | ICD-10-CM | POA: Diagnosis not present

## 2016-03-20 DIAGNOSIS — R079 Chest pain, unspecified: Secondary | ICD-10-CM | POA: Diagnosis not present

## 2016-03-20 DIAGNOSIS — I10 Essential (primary) hypertension: Secondary | ICD-10-CM | POA: Diagnosis not present

## 2016-03-20 DIAGNOSIS — Z882 Allergy status to sulfonamides status: Secondary | ICD-10-CM | POA: Diagnosis not present

## 2016-03-20 DIAGNOSIS — G8929 Other chronic pain: Secondary | ICD-10-CM | POA: Diagnosis not present

## 2016-03-20 DIAGNOSIS — R9431 Abnormal electrocardiogram [ECG] [EKG]: Secondary | ICD-10-CM | POA: Diagnosis not present

## 2016-03-20 DIAGNOSIS — R6884 Jaw pain: Secondary | ICD-10-CM | POA: Diagnosis not present

## 2016-03-20 DIAGNOSIS — Z888 Allergy status to other drugs, medicaments and biological substances status: Secondary | ICD-10-CM | POA: Diagnosis not present

## 2016-03-20 DIAGNOSIS — M549 Dorsalgia, unspecified: Secondary | ICD-10-CM | POA: Diagnosis not present

## 2016-03-20 DIAGNOSIS — D573 Sickle-cell trait: Secondary | ICD-10-CM | POA: Diagnosis not present

## 2016-03-20 DIAGNOSIS — Z79899 Other long term (current) drug therapy: Secondary | ICD-10-CM | POA: Diagnosis not present

## 2016-04-17 ENCOUNTER — Ambulatory Visit (INDEPENDENT_AMBULATORY_CARE_PROVIDER_SITE_OTHER): Payer: BLUE CROSS/BLUE SHIELD | Admitting: Cardiovascular Disease

## 2016-04-17 ENCOUNTER — Encounter: Payer: Self-pay | Admitting: Cardiovascular Disease

## 2016-04-17 VITALS — BP 115/72 | HR 101 | Ht 61.0 in | Wt 184.8 lb

## 2016-04-17 DIAGNOSIS — E785 Hyperlipidemia, unspecified: Secondary | ICD-10-CM | POA: Diagnosis not present

## 2016-04-17 DIAGNOSIS — R000 Tachycardia, unspecified: Secondary | ICD-10-CM

## 2016-04-17 DIAGNOSIS — Z8249 Family history of ischemic heart disease and other diseases of the circulatory system: Secondary | ICD-10-CM

## 2016-04-17 DIAGNOSIS — K5090 Crohn's disease, unspecified, without complications: Secondary | ICD-10-CM

## 2016-04-17 DIAGNOSIS — E8881 Metabolic syndrome: Secondary | ICD-10-CM

## 2016-04-17 DIAGNOSIS — Z79899 Other long term (current) drug therapy: Secondary | ICD-10-CM | POA: Diagnosis not present

## 2016-04-17 DIAGNOSIS — I10 Essential (primary) hypertension: Secondary | ICD-10-CM | POA: Diagnosis not present

## 2016-04-17 NOTE — Progress Notes (Signed)
Patient ID: Joan Richardson, female   DOB: Sep 23, 1965, 51 y.o.   MRN: [id number]     HPI: Joan Richardson is a 51 y.o. female who presents to the office today for23 month cardiology followup evaluation.    Joan Richardson has a strong family history for premature CAD in multiple family members with several uncles having suffered myocardial infarctions in their late 37s and 32s. Her mother underwent stenting in 2014. The patient has experienced episodes of chest pain which at times are described as sharp and other times as dull. At times there is left arm and neck radiation. She does note shortness of breath with walking. In October 2013 a nuclear perfusion study was normal. .  There is remote tobacco history having started at age 45 but smoked more consistently at age 51. She quit smoking in 1994. She also has a history of hypertension, as well as mild hyperlipidemia. There is a history of Crohn's disease. 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 She's unaware of any African American heritage but she does admit to [place] remotely.  In October 2013 an echo Doppler study showed normal systolic function but grade 1 diastolic dysfunction. She had mild MR and mild TR.  On 09/13/2012 she underwent a cardiopulmonary met test. This revealed reduced functional status with maximum oxygen consumption of only 62% of predicted. Her cardiovascular response was impaired that study was considered indeterminate for myocardial dysfunction due to suboptimal peak cardiovascular stress load. She did have a low anaerobic threshold again suggestive of impaired peak cardiac function. There also was evidence for ventilation/perfusion mismatch and possible increased her PFTs revealed normal FEV1 and FEV1/VC. DLCO was normal.  On prior echo, Joan Richardson did have normal systolic function with an ejection fraction greater than 55% and a grade 1 diastolic dysfunction the mitral valve E/A ratio of 0.65.  Prior to her last office visit  she developed episodes of chest pain leading to an evaluation at [facility] in [place]. She apparently had a normal chest CT. Cardiac enzymes were negative. Her chest pain was felt most likely non-cardiac. In addition, she tells me she has been followed by hematologist/oncologist for several months of low-grade fever and with the axillary lymph node. She has been anemic.    I recommended that she try to reinstitute statin therapy in light of her strong family history for coronary obstructive disease. She was started on simvastatin 20 mg. Subsequent blood work on 01/14/2013 reveals improvement in her LDL particle member at 1243, down from 1313 and her LDL was reduced from 108-90. Total cholesterol is improved from 170 07/12/1947. HDL particle numbers improved at 33. There still is some resistance was in insulin resistance scored 65. A Cmet was essentially normal with the exception of mild increased glucose at 104.  Joan Richardson states that she recently had continued to experience some shortness of breath with activity. She also notices some occasional right eye droop and also some occasional numbness in the right side of her head and face. She also tells me she is now seeing an oncologist Dr. Marilynn Rail at [facility] been found to have sickle cell trait.  She is now persistent pain in in a clinical trial for her Crohn's disease with a kinase inhibitor and is enrolled in the ABT494 study at [facility].  WhenI last saw her, she stopped taking the simvastatin due to myalgias.  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   She was seen by Richardson Dopp in June 2017 and had some vague symptoms of chest pain.  At that time.  An echo Doppler study from 08/17/2015 showed an ejection fraction of 50-55%.  She had normal diastolic parameters and valvular architecture.  She underwent a nuclear stress test which was low risk;  ejection fraction 71%.  She had normal perfusion.  She continues to participate in experimental drug for her Crohn's disease.  Recently, she states that laboratory done in this study has suggested that her cholesterol has increased.  Specifically, blood work from December 2017 showed total cholesterol 229, LDL cholesterol 136, HDL 63, and triglycerides 154.  She also has been found to have elevation of C-reactive protein.  She has noticed some right neck discomfort, but this seems more related to muscular tension and she also mitts to some occasional jaw discomfort.  She denies chest pressure.  She presents for evaluation.  Past Medical History:  Diagnosis Date  . Carotid bruit 05/09/08   carotid doppler-normal  . Chest pain    Myoview 10/13: Normal perfusion, no ischemia, EF 85%  . Crohn's disease ([place])   . History of echocardiogram    a. Echo 10/13: Normal LVEF, Gr 1 DD, mild MR, mild TR  //  b. Echo 7/17: EF 55-60%, no RWMA, normal diastolic function  . History of nuclear stress test    a. Myoview 7/17: EF 71%, normal perfusion, low risk study  . Hyperlipidemia   . Hypertension 12/10/11   Echo- normal dystolic function grade 1 diastolic dysfuction. Mitral Valve E to A ratio is 0.65. evidence for mild MR and mild TR without other significant abnormalities.  . Palpitations 12/22/11  . Sickle cell trait ([place])     No past surgical history on file.  Allergies  Allergen Reactions  . Imuran [Azathioprine]     pancreatitis  . Infed [Iron Dextran] Diarrhea and Nausea And Vomiting    Chest pain  . Lialda [Mesalamine]   . Septra [Sulfamethoxazole-Trimethoprim]   . Sulfa Antibiotics Nausea And Vomiting and Nausea Only  . Sulfamethoxazole-Trimethoprim Nausea Only    Current Outpatient Prescriptions  Medication Sig Dispense Refill  . Investigational - Study Medication Take 15 mg by mouth daily. Study name: ABT 494 Additional study details:Chron's disease    . ALPRAZolam (XANAX) 0.5 MG  tablet Take 0.5 mg by mouth as needed.    Marland Kitchen amitriptyline (ELAVIL) 10 MG tablet TAKE 2 TABLETS( 20 MG TOTAL) BY MOUTH DAILY    . baclofen (LIORESAL) 10 MG tablet Take 10 mg by mouth as needed for muscle spasms.    . Cholecalciferol (VITAMIN D3) 2000 UNITS TABS Take 1 tablet by mouth daily.    . clidinium-chlordiazePOXIDE (LIBRAX) 2.5-5 MG per capsule Take 1 capsule by mouth daily as needed (FOR SPASMS).     . Cyanocobalamin (VITAMIN B-12 SL) Place 2,500 mcg under the tongue daily.    . diazepam (DIASTAT) 2.5 MG GEL Place 2.5 mg rectally as needed for seizure.    . ezetimibe (ZETIA) 10 MG tablet TAKE 1 TABLET (10 MG TOTAL) BY MOUTH DAILY. 90 tablet 3  . hyoscyamine (LEVBID) 0.375 MG 12 hr tablet Take 0.375 mg by mouth every 12 (twelve) hours as needed for cramping.     Marland Kitchen losartan-hydrochlorothiazide (HYZAAR) 50-12.5 MG tablet Take 1 tablet by mouth daily. 90 tablet 3  . pantoprazole (PROTONIX) 40 MG tablet Take 1 tablet by mouth daily as needed (  HEARTBURN).     Marland Kitchen traMADol (ULTRAM) 50 MG tablet Take 50 mg by mouth every 6 (six) hours as needed for pain.     No current facility-administered medications for this visit.     Socially she is married and has one child. She is trying to increase her exercise level. There is no recent tobacco use. There is no alcohol.  ROS General: Negative; No fevers, chills, or night sweats;  HEENT: Negative; No changes in vision or hearing, sinus congestion, difficulty swallowing Pulmonary: Negative; No cough, wheezing, shortness of breath, hemoptysis Cardiovascular: Negative; No chest pain, presyncope, syncope, palpitations GI: History of Crohn's disease currently involved in a study at [facility] GU: Negative; No dysuria, hematuria, or difficulty voiding Musculoskeletal: Negative; no myalgias, joint pain, or weakness Hematologic/Oncology: Negative; no easy bruising, bleeding Endocrine: Negative; no heat/cold intolerance; no diabetes Neuro: Positive for  hand numbness when she sleeps and occasional facial numbness Skin: Negative; No rashes or skin lesions Psychiatric: Negative; No behavioral problems, depression Sleep: Negative; No snoring, daytime sleepiness, hypersomnolence, bruxism, restless legs, hypnogognic hallucinations, no cataplexy Other comprehensive 14 point system review is negative.   PE BP 115/72   Pulse (!) 101   Ht 5' 1"  (1.549 m)   Wt 184 lb 12.8 oz (83.8 kg)   BMI 34.92 kg/m    Wt Readings from Last 3 Encounters:  04/17/16 184 lb 12.8 oz (83.8 kg)  07/30/15 185 lb (83.9 kg)  05/23/14 176 lb 3.2 oz (79.9 kg)   General: Alert, oriented, no distress.  Skin: normal turgor, no rashes HEENT: Normocephalic, atraumatic. Pupils round and reactive; sclera anicteric;no lid lag.  Nose without nasal septal hypertrophy Mouth/Parynx benign; Mallinpatti scale 2 Neck: No JVD, no carotid bruits Back: No CVA tenderness. Chest: No chest wall tenderness to palpation. Lungs: clear to ausculatation and percussion; no wheezing or rales Heart: RRR, s1 s2 normal 1/6 systolic murmur.  No diastolic murmur.  No rubs thrills or heaves. Abdomen: soft, nontender; no hepatosplenomehaly, BS+; abdominal aorta nontender and not dilated by palpation. Back: No CVA tenderness Pulses 2+ Extremities: no clubbing cyanosis or edema, Homan's sign negative  Neurologic: grossly nonfocal Psychologic: Normal affect and mood. Normal cognition.  ECG (independently read by me): Sinus tachycardia 101 bpm.  Normal intervals.  No ST segment changes.  April 2016 ECG (independently read by me): Normal sinus rhythm at 87 beats per minute.  QTc interval 440 ms.  December 2014 ECG normal sinus rhythm at 95 beats per minute. Nonspecific ST changes.  LABS:  BMP Latest Ref Rng & Units 01/14/2013 08/18/2012  Glucose 70 - 99 mg/dL 104(H) 102(H)  BUN 6 - 23 mg/dL 11 10  Creatinine 0.50 - 1.10 mg/dL 0.67 0.71  Sodium 135 - 145 mEq/L 138 138  Potassium 3.5 - 5.3 mEq/L  4.5 4.6  Chloride 96 - 112 mEq/L 101 99  CO2 19 - 32 mEq/L 27 24  Calcium 8.4 - 10.5 mg/dL 9.9 9.3    Hepatic Function Latest Ref Rng & Units 01/14/2013 08/18/2012  Total Protein 6.0 - 8.3 g/dL 7.6 7.2  Albumin 3.5 - 5.2 g/dL 4.1 4.4  AST 0 - 37 U/L 18 23  ALT 0 - 35 U/L 12 19  Alk Phosphatase 39 - 117 U/L 109 123(H)  Total Bilirubin 0.3 - 1.2 mg/dL 0.6 0.5    CBC Latest Ref Rng & Units 08/18/2012  WBC 4.0 - 10.5 K/uL 11.5(H)  Hemoglobin 12.0 - 15.0 g/dL 12.9  Hematocrit 36.0 - 46.0 %  39.1  Platelets 150 - 400 K/uL 389   Lab Results  Component Value Date   MCV 80.6 08/18/2012   Lab Results  Component Value Date   TSH 2.144 08/18/2012     BNP No results found for: PROBNP    Lipid Panel     Component Value Date/Time   CHOL 149 01/14/2013 0922   TRIG 73 01/14/2013 0922   HDL 44 01/14/2013 0922   LDLCALC 90 01/14/2013 0922     RADIOLOGY: No results found.  IMPRESSION:  1. Essential hypertension   2. Hyperlipidemia LDL goal <70   3. Medication management   4. Tachycardia   5. Family history of premature CAD   6. Metabolic syndrome   7. Crohn's disease without complication, unspecified gastrointestinal tract location [facility])     ASSESSMENT AND PLAN: Mrs.  Chaselyn Richardson is a 51 year old female who has a very strong family history for premature coronary artery disease and has cardiac risk factors  notable for hypertension, mild hyperlipidemia, as well as remote tobacco use. She previously had undergone a nuclear perfusion scan in October 2013 which showed normal perfusion. She had continued to experience shortness of breath particularly with exertion. A chest CT in August 2014 was negative. A cardiopulmonary met test has  shown impaired maximum oxygen consumption with reduced anaerobic threshold. It was felt most likely her findings were consistent with her documented diastolic dysfunction noted on echocardiography.  She had developed some recurrent chest pain and in July  2017 had undergone a nuclear perfusion study which was normal and an echo Doppler study which was about without significant abnormality.  She is participating in a clinical trial for her Crohn's disease and never took Humira.  I reviewed recent laboratory.  Her blood pressure today is controlled on losartan HCT 50/12.5.  Her resting pulse is increased today at 101.  She is obese with a BMI of 34.9, and weight reduction and exercise was recommended.  I suspect her neck discomfort is musculoskeletal and not ischemic discomfort.  She continues to be on Zetia alone for hyperlipidemia, which may not be adequate, particularly with her strong family history.  Apparently she had some intolerance to statins in the past.  I have recommended rechecking lipid panel.  I will also check a liid panel, hemoglobin A1c and f/u CRP since I suspect she has metabolic syndrome and remotely had insulin resistance.  Her GERD is controlled with pantoprazole and now when necessary Zantac.  I will see her in 6 months for reevaluation.  Time spent: 25 minutes  Troy Sine, MD, [facility]  04/17/2016 5:46 PM

## 2016-04-17 NOTE — Patient Instructions (Addendum)
Your physician recommends that you return for lab work fasting.   Your physician wants you to follow-up in: 6 months or sooner if needed. You will receive a reminder letter in the mail two months in advance. If you don't receive a letter, please call our office to schedule the follow-up appointment.  If you need a refill on your cardiac medications before your next appointment, please call your pharmacy.

## 2016-05-01 DIAGNOSIS — Z0000 Encounter for general adult medical examination without abnormal findings: Secondary | ICD-10-CM | POA: Diagnosis not present

## 2016-05-20 ENCOUNTER — Telehealth: Payer: Self-pay | Admitting: *Deleted

## 2016-05-20 DIAGNOSIS — Z79899 Other long term (current) drug therapy: Secondary | ICD-10-CM

## 2016-05-20 NOTE — Telephone Encounter (Addendum)
-----   Message from Troy Sine, MD sent at 05/18/2016 10:37 PM EDT ----- LDL 117, better on zetia;  Can she take low dose statin with crestor initially at 5 mg, had myalgsa in past with other statins/ Consider trying and if so then re-check in 6 weeks   Left message for pt to call

## 2016-05-22 MED ORDER — ROSUVASTATIN CALCIUM 5 MG PO TABS: 5.0000 mg | ORAL_TABLET | Freq: Every day | ORAL | 3 refills | Status: DC

## 2016-05-22 NOTE — Telephone Encounter (Signed)
Follow up  Pt voiced returning nurses call.

## 2016-05-22 NOTE — Telephone Encounter (Signed)
I spoke w patient. She expressed understanding of Dr. Evette Georges recommendations, and is agreeable to starting crestor as advised. She will get lipids rechecked in 6 weeks. She's aware to call if side effects once started on the crestor. I've sent lab slips in mail at pt request.  She asked if Dr. Claiborne Billings can review the HgA1C and CRP sent (see last scanned lab results). Notes CRP was 13.9 most recently, down from running in 20s. Routed for advice.

## 2016-05-26 NOTE — Telephone Encounter (Signed)
HbA1C is good;  CRP still elevated but better;  Continue ASA 81 mg

## 2016-05-26 NOTE — Telephone Encounter (Signed)
Results and recommendations discussed with patient, who verbalized understanding and thanks.

## 2016-05-29 ENCOUNTER — Telehealth: Payer: Self-pay | Admitting: *Deleted

## 2016-05-29 NOTE — Telephone Encounter (Signed)
-----   Message from Troy Sine, MD sent at 05/18/2016 10:37 PM EDT ----- LDL 117, better on zetia;  Can she take low dose statin with crestor initially at 5 mg, had myalgsa in past with other statins/ Consider trying and if so then re-check in 6 weeks

## 2016-05-29 NOTE — Telephone Encounter (Signed)
LMTCB

## 2016-06-02 DIAGNOSIS — M25551 Pain in right hip: Secondary | ICD-10-CM | POA: Diagnosis not present

## 2016-06-02 DIAGNOSIS — M545 Low back pain: Secondary | ICD-10-CM | POA: Diagnosis not present

## 2016-06-05 ENCOUNTER — Telehealth: Payer: Self-pay | Admitting: Cardiovascular Disease

## 2016-06-05 NOTE — Telephone Encounter (Signed)
Joan Richardson is calling to speak with  [name] ** Did Not give a reason

## 2016-06-05 NOTE — Telephone Encounter (Signed)
Spoke to patient who noted she got a call last week from [place]. I reviewed and informed her this was to discuss same results i'd called her about previously - explained that I had forgotten to send Mariann Laster a msg notifying her that we'd discussed and apologized for the mistake. Pt voiced that she was appreciative of the clarification, and had no further questions or concerns.

## 2016-06-09 DIAGNOSIS — M545 Low back pain: Secondary | ICD-10-CM | POA: Diagnosis not present

## 2016-06-10 DIAGNOSIS — R609 Edema, unspecified: Secondary | ICD-10-CM | POA: Diagnosis not present

## 2016-06-10 DIAGNOSIS — M545 Low back pain: Secondary | ICD-10-CM | POA: Diagnosis not present

## 2016-06-10 DIAGNOSIS — M47816 Spondylosis without myelopathy or radiculopathy, lumbar region: Secondary | ICD-10-CM | POA: Diagnosis not present

## 2016-06-10 DIAGNOSIS — M5186 Other intervertebral disc disorders, lumbar region: Secondary | ICD-10-CM | POA: Diagnosis not present

## 2016-07-01 DIAGNOSIS — M545 Low back pain: Secondary | ICD-10-CM | POA: Diagnosis not present

## 2016-07-01 DIAGNOSIS — M5416 Radiculopathy, lumbar region: Secondary | ICD-10-CM | POA: Diagnosis not present

## 2016-08-21 ENCOUNTER — Other Ambulatory Visit: Payer: Self-pay | Admitting: Physician Assistant

## 2016-08-21 NOTE — Telephone Encounter (Signed)
Rx(s) sent to pharmacy electronically.  

## 2016-09-30 DIAGNOSIS — Z1231 Encounter for screening mammogram for malignant neoplasm of breast: Secondary | ICD-10-CM | POA: Diagnosis not present

## 2016-11-21 DIAGNOSIS — J0111 Acute recurrent frontal sinusitis: Secondary | ICD-10-CM | POA: Diagnosis not present

## 2016-11-21 DIAGNOSIS — J069 Acute upper respiratory infection, unspecified: Secondary | ICD-10-CM | POA: Diagnosis not present

## 2016-12-23 ENCOUNTER — Ambulatory Visit: Payer: BLUE CROSS/BLUE SHIELD | Admitting: Physician Assistant

## 2016-12-23 ENCOUNTER — Encounter: Payer: Self-pay | Admitting: Physician Assistant

## 2016-12-23 VITALS — BP 112/80 | HR 86 | Ht 61.0 in | Wt 188.0 lb

## 2016-12-23 DIAGNOSIS — I10 Essential (primary) hypertension: Secondary | ICD-10-CM | POA: Diagnosis not present

## 2016-12-23 DIAGNOSIS — R079 Chest pain, unspecified: Secondary | ICD-10-CM

## 2016-12-23 DIAGNOSIS — K50919 Crohn's disease, unspecified, with unspecified complications: Secondary | ICD-10-CM | POA: Diagnosis not present

## 2016-12-23 DIAGNOSIS — E785 Hyperlipidemia, unspecified: Secondary | ICD-10-CM | POA: Diagnosis not present

## 2016-12-23 DIAGNOSIS — Z79899 Other long term (current) drug therapy: Secondary | ICD-10-CM

## 2016-12-23 DIAGNOSIS — R002 Palpitations: Secondary | ICD-10-CM

## 2016-12-23 LAB — BASIC METABOLIC PANEL
BUN/Creatinine Ratio: 13 (ref 9–23)
BUN: 10 mg/dL (ref 6–24)
CO2: 23 mmol/L (ref 20–29)
Calcium: 9.8 mg/dL (ref 8.7–10.2)
Chloride: 99 mmol/L (ref 96–106)
Creatinine, Ser: 0.8 mg/dL (ref 0.57–1.00)
GFR, EST AFRICAN AMERICAN: 99 mL/min/{1.73_m2} (ref >59)
GFR, EST NON AFRICAN AMERICAN: 86 mL/min/{1.73_m2} (ref >59)
Glucose: 103 mg/dL — ABNORMAL HIGH (ref 65–99)
POTASSIUM: 4.8 mmol/L (ref 3.5–5.2)
SODIUM: 140 mmol/L (ref 134–144)

## 2016-12-23 MED ORDER — ROSUVASTATIN CALCIUM 10 MG PO TABS: 10.0000 mg | ORAL_TABLET | Freq: Every day | ORAL | 3 refills | Status: DC

## 2016-12-23 NOTE — Progress Notes (Signed)
Cardiology Office Note    Date:  12/23/2016   ID:  Joan Richardson, DOB 06/07/1965, MRN [id number]  PCP:  Orpah Melter, MD  Cardiologist:  Dr. Claiborne Billings   Chief Complaint  Patient presents with  . Follow-up    8 months  . Shortness of Breath  . Headache  . Chest Pain  . Edema    Ankles and feet swell during the day and hands swell when she walks.    History of Present Illness:  Joan Richardson is a 51 y.o. female with PMH of Crohn's disease, HTN, HLD, sickle cell trait, and h/o CP with negative myoview. She has strong family history of premature CAD with several uncles having suffered an MI in their late 71s and 77s. She has experienced episode of chest pain at times described as sharp and other times is dull. She has quit smoking in 1994. Myoview in October 2013 was normal. Echocardiogram done at the same time showed normal LV function, grade 1 DD, mild MR and TR. She had a cardiopulmonary stress test in August 2014, and this revealed reduced functionty with maximum oxygen consumption of only 62% of predicted. cardiovascular response was impaired and the study was considered intermediate for myocardial dysfunction due to suboptimal peak cardiovascular stress load. Last echocardiogram obtained on 08/17/2015 showed EF 55-60%. Myoview obtained on the same day showed normal EF without evidence of ischemia.  Patient presents today for cardiology office visit. She complains of substernal chest pain across the entire chest for the past few months. It occurs several minutes at a time, there is no exacerbating factors or alleviating factors. It occurs both at rest and with exertion. She is also been noticing increasing shortness of breath as well. She says her job is very stressful, sometimes she will have chest discomfort with emotional stress as well. She has had multiple cardiac evaluation in the past for chest discomfort. I wish to obtain a more definitive workup. I recommended a coronary CT for further  assessment. She also complaining of occasional palpitation as well. She is not on any beta blocker, I will add metoprolol tartrate 12.5 mg BID. I have instructed her to hold her losartan-HCTZ on the day of coronary CT in case more metoprolol will needed to be added in order to control her heart rate. I also increased her Crestor to 10 mg daily. She just had lipid panel checked on 11/28/2016 at [facility]. LDL 103, cholesterol 183, HDL 44, triglyceride 183. I briefly discussed with her regarding PCSK 9 inhibitor, if her cholesteral remain uncontrolled, I would recommend referral to the lipid clinic. She wished to hold off on this referral.    Past Medical History:  Diagnosis Date  . Carotid bruit 05/09/08   carotid doppler-normal  . Chest pain    Myoview 10/13: Normal perfusion, no ischemia, EF 85%  . Crohn's disease ([place])   . History of echocardiogram    a. Echo 10/13: Normal LVEF, Gr 1 DD, mild MR, mild TR  //  b. Echo 7/17: EF 55-60%, no RWMA, normal diastolic function  . History of nuclear stress test    a. Myoview 7/17: EF 71%, normal perfusion, low risk study  . Hyperlipidemia   . Hypertension 12/10/11   Echo- normal dystolic function grade 1 diastolic dysfuction. Mitral Valve E to A ratio is 0.65. evidence for mild MR and mild TR without other significant abnormalities.  . Palpitations 12/22/11  . Sickle cell trait ([place])  History reviewed. No pertinent surgical history.  Current Medications: Outpatient Medications Prior to Visit  Medication Sig Dispense Refill  . ALPRAZolam (XANAX) 0.5 MG tablet Take 0.5 mg by mouth as needed.    Marland Kitchen amitriptyline (ELAVIL) 10 MG tablet TAKE 2 TABLETS( 20 MG TOTAL) BY MOUTH DAILY    . baclofen (LIORESAL) 10 MG tablet Take 10 mg by mouth as needed for muscle spasms.    . Cholecalciferol (VITAMIN D3) 2000 UNITS TABS Take 1 tablet by mouth daily.    . clidinium-chlordiazePOXIDE (LIBRAX) 2.5-5 MG per capsule Take 1 capsule by mouth daily as needed  (FOR SPASMS).     . Cyanocobalamin (VITAMIN B-12 SL) Place 2,500 mcg under the tongue daily.    . diazepam (DIASTAT) 2.5 MG GEL Place 2.5 mg rectally as needed for seizure.    . ezetimibe (ZETIA) 10 MG tablet TAKE 1 TABLET (10 MG TOTAL) BY MOUTH DAILY. 90 tablet 2  . hyoscyamine (LEVBID) 0.375 MG 12 hr tablet Take 0.375 mg by mouth every 12 (twelve) hours as needed for cramping.     . Investigational - Study Medication Take 15 mg by mouth daily. Study name: [id number] [id number] Additional study details:Chron's disease    . losartan-hydrochlorothiazide (HYZAAR) 50-12.5 MG tablet TAKE 1 TABLET BY MOUTH DAILY. 90 tablet 2  . pantoprazole (PROTONIX) 40 MG tablet Take 1 tablet by mouth daily as needed (HEARTBURN).     Marland Kitchen traMADol (ULTRAM) 50 MG tablet Take 50 mg by mouth every 6 (six) hours as needed for pain.    . rosuvastatin (CRESTOR) 5 MG tablet Take 1 tablet (5 mg total) by mouth daily. 90 tablet 3   No facility-administered medications prior to visit.      Allergies:   Imuran [azathioprine]; Infed [iron dextran]; Lialda [mesalamine]; Septra [sulfamethoxazole-trimethoprim]; Sulfa antibiotics; and Sulfamethoxazole-trimethoprim   Social History   Socioeconomic History  . Marital status: Married    Spouse name: None  . Number of children: None  . Years of education: None  . Highest education level: None  Social Needs  . Financial resource strain: None  . Food insecurity - worry: None  . Food insecurity - inability: None  . Transportation needs - medical: None  . Transportation needs - non-medical: None  Occupational History  . None  Tobacco Use  . Smoking status: Former Smoker    Years: 13.00    Types: Cigarettes    Last attempt to quit: 05/11/1992    Years since quitting: 24.6  . Smokeless tobacco: Never Used  . Tobacco comment: April 1994  Substance and Sexual Activity  . Alcohol use: No  . Drug use: No  . Sexual activity: Yes  Other Topics Concern  . None  Social History Narrative  .  None     Family History:  The patient's family history includes Arrhythmia in her sister; COPD in her father; Cancer - Other in her paternal grandmother; Emphysema in her brother; Heart attack (age of onset: 95) in her mother; Hyperlipidemia in her mother; Hypertension in her father; Stroke in her maternal grandfather.   ROS:   Please see the history of present illness.    ROS All other systems reviewed and are negative.   PHYSICAL EXAM:   VS:  BP 112/80   Pulse 86   Ht 5' 1"  (1.549 m)   Wt 188 lb (85.3 kg)   BMI 35.52 kg/m    GEN: Well nourished, well developed, in no acute distress  HEENT: normal  Neck:  no JVD, carotid bruits, or masses Cardiac: RRR; no murmurs, rubs, or gallops,no edema  Respiratory:  clear to auscultation bilaterally, normal work of breathing GI: soft, nontender, nondistended, + BS MS: no deformity or atrophy  Skin: warm and dry, no rash Neuro:  Alert and Oriented x 3, Strength and sensation are intact Psych: euthymic mood, full affect  Wt Readings from Last 3 Encounters:  12/23/16 188 lb (85.3 kg)  04/17/16 184 lb 12.8 oz (83.8 kg)  07/30/15 185 lb (83.9 kg)      Studies/Labs Reviewed:   EKG:  EKG is ordered today.  The ekg ordered today demonstrates normal sinus rhythm without significant ST-T wave changes  Recent Labs: No results found for requested labs within last 8760 hours.   Lipid Panel    Component Value Date/Time   CHOL 149 01/14/2013 0922   TRIG 73 01/14/2013 0922   HDL 44 01/14/2013 0922   LDLCALC 90 01/14/2013 0922    Additional studies/ records that were reviewed today include:   Echo 08/17/2015 LV EF: 55% -   60%  ------------------------------------------------------------------- Indications:      (R06.02).  ------------------------------------------------------------------- History:   PMH:  Acquired from the patient and from the patient&'s chart.  Dyspnea.  Risk factors:  Former tobacco use. Hypertension. Obese.  Dyslipidemia.  ------------------------------------------------------------------- Study Conclusions  - Left ventricle: The cavity size was normal. Systolic function was   normal. The estimated ejection fraction was in the range of 55%   to 60%. Wall motion was normal; there were no regional wall   motion abnormalities. Left ventricular diastolic function   parameters were normal. - Atrial septum: No defect or patent foramen ovale was identified.    Myoview 08/17/2015 Study Highlights    Nuclear stress EF: 71%.  There was no ST segment deviation noted during stress.  The study is normal.  This is a low risk study.  The left ventricular ejection fraction is hyperdynamic (>65%).     ASSESSMENT:    1. Chest pain, unspecified type   2. Heart palpitations   3. Medication management   4. Essential hypertension   5. Hyperlipidemia, unspecified hyperlipidemia type   6. Crohn's disease with complication, unspecified gastrointestinal tract location [facility])      PLAN:  In order of problems listed above:  1. Chest pain: She has been having more chest discomfort for the past few months. It does not necessarily occur with exertion, sometimes can occur with emotional triggers as well. Has had multiple workups in the past. I do not think she necessarily need a cardiac catheterization, however given recurrent symptom and worsening shortness of breath. I wish to obtain a coronary CT with FFR to assess her cardiovascular risk  2. Palpitation: She is not on a beta blocker, I will add metoprolol 12.5 mg twice a day for management of palpation.  3. Hypertension: blood pressure well-controlled, will add metoprolol titrate 12.5 mg twice a day for rate control purposes and the suppression of palpitation.  4. Hyperlipidemia: on Crestor 5 mg daily along with Zetia. Recent lab work reviewed, we'll increase Crestor to 10 medical daily. Consider referral to lipid clinic for the evaluation of PCSK 9  inhibitor. She will obtain fasting lipid panel and LFTs in 8 weeks at [facility].  5. Crohn's disease: On experimental medication at this time.    Medication Adjustments/Labs and Tests Ordered: Current medicines are reviewed at length with the patient today.  Concerns regarding medicines are outlined above.  Medication changes, Labs  and Tests ordered today are listed in the Patient Instructions below. Patient Instructions  Medication Instructions:   START metoprolol tartrate 12.58m twice daily INCREASE crestor to 143mdaily  Labwork:  Your physician recommends that you return for lab work TODAY - BMET  Testing/Procedures:  HaIsaac LaudPAUtahas ordered a Coronary CT - instructions below -- please HOLD your losartan-hctz the morning of the procedure  Follow-Up:  Your physician recommends that you schedule a [place] Ke[place]orne Billings If you need a refill on your cardiac medications before your next appointment, please call your pharmacy.  Any Other Special Instructions Will Be Listed Below (If Applicable).   Please [facility] at the [facility] of [facility] xx:xx AM (30-45 minutes prior to test start time)  [facility] [phone] to the [facility] Department (First Floor).  Please follow these instructions carefully (unless otherwise directed):  Hold all erectile dysfunction medications at least 48 hours prior to test.  On the Night Before the Test: . Drink plenty of water. . Do not consume any caffeinated/decaffeinated beverages or chocolate 12 hours prior to your test. . Do not take any antihistamines 12 hours prior to your test. . If you take Metformin do not take 24 hours prior to test. . If the patient has contrast allergy: ? Patient will need a prescription for Prednisone and very clear instructions (as follows): 1. Prednisone 50 mg - take 13  hours prior to test 2. Take another Prednisone 50 mg 7 hours prior to test 3. Take another Prednisone 50 mg 1 hour prior to test 4. Take Benadryl 50 mg 1 hour prior to test . Patient must complete all four doses of above prophylactic medications. . Patient will need a ride after test due to Benadryl.  On the Day of the Test: . Drink plenty of water. Do not drink any water within one hour of the test. . Do not eat any food 4 hours prior to the test. . You may take your regular medications prior to the test. . IF NOT ON A BETA BLOCKER - Take 50 mg of lopressor (metoprolol) one hour before the test. . HOLD Furosemide morning of the test.  After the Test: . Drink plenty of water. . After receiving IV contrast, you may experience a mild flushed feeling. This is normal. . On occasion, you may experience a mild rash up to 24 hours after the test. This is not dangerous. If this occurs, you can take Benadryl 25 mg and increase your fluid intake. . If you experience trouble breathing, this can be serious. If it is severe call 911 IMMEDIATELY. If it is mild, please call our office. . If you take any of these medications: Glipizide/Metformin, Avandament, Glucavance, please do not take 48 hours after completing test.      Signed, HaAlmyra DeforestPA  12/23/2016 1:13 PM    [facility] [address] [place]  [id number]: [phone]: [phone]

## 2016-12-23 NOTE — Patient Instructions (Addendum)
Medication Instructions:   START metoprolol tartrate 12.35m twice daily INCREASE crestor to 124mdaily  Labwork:  Your physician recommends that you return for lab work TODAY - BMET  Testing/Procedures:  HaIsaac LaudPAUtahas ordered a Coronary CT - instructions below -- please HOLD your losartan-hctz the morning of the procedure  Follow-Up:  Your physician recommends that you schedule a follow-up appointment [place]. Ke[place]iborne Billings If you need a refill on your cardiac medications before your next appointment, please call your pharmacy.  Any Other Special Instructions Will Be Listed Below (If Applicable).   Please [facility] the [facility] of [facility] xx:xx AM (30-45 minutes prior to test start time)  [facility] [phone] to the [facility] Department (First Floor).  Please follow these instructions carefully (unless otherwise directed):  Hold all erectile dysfunction medications at least 48 hours prior to test.  On the Night Before the Test: . Drink plenty of water. . Do not consume any caffeinated/decaffeinated beverages or chocolate 12 hours prior to your test. . Do not take any antihistamines 12 hours prior to your test. . If you take Metformin do not take 24 hours prior to test. . If the patient has contrast allergy: ? Patient will need a prescription for Prednisone and very clear instructions (as follows): 1. Prednisone 50 mg - take 13 hours prior to test 2. Take another Prednisone 50 mg 7 hours prior to test 3. Take another Prednisone 50 mg 1 hour prior to test 4. Take Benadryl 50 mg 1 hour prior to test . Patient must complete all four doses of above prophylactic medications. . Patient will need a ride after test due to Benadryl.  On the Day of the Test: . Drink plenty of water. Do not drink any water within one hour of the test. . Do not eat any food 4  hours prior to the test. . You may take your regular medications prior to the test. . IF NOT ON A BETA BLOCKER - Take 50 mg of lopressor (metoprolol) one hour before the test. . HOLD Furosemide morning of the test.  After the Test: . Drink plenty of water. . After receiving IV contrast, you may experience a mild flushed feeling. This is normal. . On occasion, you may experience a mild rash up to 24 hours after the test. This is not dangerous. If this occurs, you can take Benadryl 25 mg and increase your fluid intake. . If you experience trouble breathing, this can be serious. If it is severe call 911 IMMEDIATELY. If it is mild, please call our office. . If you take any of these medications: Glipizide/Metformin, Avandament, Glucavance, please do not take 48 hours after completing test.

## 2017-01-13 ENCOUNTER — Telehealth: Payer: Self-pay | Admitting: Physician Assistant

## 2017-01-13 ENCOUNTER — Other Ambulatory Visit: Payer: Self-pay | Admitting: Physician Assistant

## 2017-01-13 MED ORDER — METOPROLOL TARTRATE 25 MG PO TABS: 12.5000 mg | ORAL_TABLET | Freq: Two times a day (BID) | ORAL | 3 refills | Status: DC

## 2017-01-13 NOTE — Telephone Encounter (Signed)
Patient returned call. Apologized that metoprolol tartrate was not previously sent to pharmacy. Advised it is sent, and she should pick this up. She asked if she should take a full tablet (53m) the AM of the test.. general coronary CT instructions state if not already on beta-blocker, take 532m1 hour prior to test. Will ask HaIsaac LaudPAUtahShe also requests DeVashti Heyall her back in regards to her CT - skype sent to notify her

## 2017-01-13 NOTE — Telephone Encounter (Signed)
LMTCB  Per last PA note, was to start on metoprolol tartrate 12.72m BID. Not sent to pharmacy. Rx(s) sent to pharmacy electronically. Per CT instructions, if NOT already on b-blocker, take metoprolol tartrate 556m1 hour prior to test. Per PA instructions, patient should HOLD Hyzaar AM of test.

## 2017-01-13 NOTE — Telephone Encounter (Signed)
°*  STAT* If patient is at the pharmacy, call can be transferred to refill team.   1. Which medications need to be refilled? (please list name of each medication and dose if known) new prescription for Metoprolol  2. Which pharmacy/location (including street and city if local pharmacy) is medication to be sent to?[facility] [phone] 3. Do they need a 30 day or 90 day supply? 30 and refills

## 2017-01-13 NOTE — Telephone Encounter (Signed)
Pt wants to know what is she supposed to do about her Metoprolol on the day of her CT?

## 2017-01-14 NOTE — Telephone Encounter (Signed)
Continue 12.33m BID metoprolol, in AM of coronary CT, may take single [place]. However keep herself hydrated after the coronary CT to avoid significant drop in blood pressure.

## 2017-01-14 NOTE — Telephone Encounter (Signed)
Patient called with PA instructions for coronary CT. She voiced understanding.

## 2017-01-28 ENCOUNTER — Ambulatory Visit (HOSPITAL_COMMUNITY)
Admission: RE | Admit: 2017-01-28 | Discharge: 2017-01-28 | Disposition: A | Discharge status: Home / Self Care | Payer: BLUE CROSS/BLUE SHIELD | Source: Ambulatory Visit | Attending: Physician Assistant | Admitting: Physician Assistant

## 2017-01-28 ENCOUNTER — Ambulatory Visit (HOSPITAL_COMMUNITY): Payer: BLUE CROSS/BLUE SHIELD

## 2017-01-28 DIAGNOSIS — R079 Chest pain, unspecified: Secondary | ICD-10-CM | POA: Diagnosis not present

## 2017-01-28 MED ORDER — NITROGLYCERIN 0.4 MG SL SUBL
0.4000 mg | SUBLINGUAL_TABLET | Freq: Once | SUBLINGUAL | Status: AC
  Administered 2017-01-28: 0.4 mg via SUBLINGUAL

## 2017-01-28 MED ORDER — IOPAMIDOL (ISOVUE-370) INJECTION 76%
INTRAVENOUS | Status: AC
  Administered 2017-01-28: 80 mL via INTRAVENOUS
  Filled 2017-01-28: qty 100

## 2017-01-28 MED ORDER — NITROGLYCERIN 0.4 MG SL SUBL
SUBLINGUAL_TABLET | SUBLINGUAL | Status: AC
  Administered 2017-01-28: 0.4 mg via SUBLINGUAL
  Filled 2017-01-28: qty 1

## 2017-02-05 ENCOUNTER — Telehealth: Payer: Self-pay | Admitting: *Deleted

## 2017-02-05 NOTE — Telephone Encounter (Signed)
Message left for the patient to call back. She will need a follow up appointment with Dr. Claiborne Billings. Per previous conversation with the patient she has complaints of shortness of breath.   Notes recorded by Almyra Deforest, PA on 02/04/2017 at 5:10 PM EST Need close outpatient visit with Dr Claiborne Billings ------  Notes recorded by Ricci Barker, RN on 02/04/2017 at 3:12 PM EST Patient made aware of results and verbalized her understanding. She stated that she is having shortness of breath when active that continues to get worse. ------  Notes recorded by Almyra Deforest, PA on 01/29/2017 at 3:58 PM EST Only very mild disease noted in the beginning of a side branch of coronary blood vessel, it is not significant enough to interfere with blood flow. Her chest pain unlikely to come from heart

## 2017-02-05 NOTE — Telephone Encounter (Signed)
-----   Message from [place], [place] sent at 02/04/2017  5:10 PM EST ----- Need close outpatient visit with Dr Claiborne Billings

## 2017-02-12 ENCOUNTER — Telehealth: Payer: Self-pay | Admitting: Cardiovascular Disease

## 2017-02-12 NOTE — Telephone Encounter (Signed)
Called the patient and LVM to call back to schedule with Dr. Claiborne Billings or one of his PAs.

## 2017-03-25 ENCOUNTER — Ambulatory Visit: Payer: BLUE CROSS/BLUE SHIELD | Admitting: Adult Health

## 2017-05-18 ENCOUNTER — Ambulatory Visit: Payer: BLUE CROSS/BLUE SHIELD | Admitting: Cardiovascular Disease

## 2017-06-02 DIAGNOSIS — J069 Acute upper respiratory infection, unspecified: Secondary | ICD-10-CM | POA: Diagnosis not present

## 2017-06-02 DIAGNOSIS — J0100 Acute maxillary sinusitis, unspecified: Secondary | ICD-10-CM | POA: Diagnosis not present

## 2017-06-09 DIAGNOSIS — E785 Hyperlipidemia, unspecified: Secondary | ICD-10-CM | POA: Diagnosis not present

## 2017-06-09 DIAGNOSIS — R600 Localized edema: Secondary | ICD-10-CM | POA: Diagnosis not present

## 2017-06-09 DIAGNOSIS — K5090 Crohn's disease, unspecified, without complications: Secondary | ICD-10-CM | POA: Diagnosis not present

## 2017-06-09 DIAGNOSIS — Z882 Allergy status to sulfonamides status: Secondary | ICD-10-CM | POA: Diagnosis not present

## 2017-06-09 DIAGNOSIS — Z6837 Body mass index (BMI) 37.0-37.9, adult: Secondary | ICD-10-CM | POA: Diagnosis not present

## 2017-06-09 DIAGNOSIS — K5010 Crohn's disease of large intestine without complications: Secondary | ICD-10-CM | POA: Diagnosis not present

## 2017-06-09 DIAGNOSIS — Z23 Encounter for immunization: Secondary | ICD-10-CM | POA: Diagnosis not present

## 2017-06-09 DIAGNOSIS — Z87891 Personal history of nicotine dependence: Secondary | ICD-10-CM | POA: Diagnosis not present

## 2017-06-09 DIAGNOSIS — M79673 Pain in unspecified foot: Secondary | ICD-10-CM | POA: Diagnosis not present

## 2017-06-19 DIAGNOSIS — Z0000 Encounter for general adult medical examination without abnormal findings: Secondary | ICD-10-CM | POA: Diagnosis not present

## 2017-07-02 DIAGNOSIS — D691 Qualitative platelet defects: Secondary | ICD-10-CM | POA: Diagnosis not present

## 2017-07-25 ENCOUNTER — Other Ambulatory Visit: Payer: Self-pay | Admitting: Cardiovascular Disease

## 2017-08-05 DIAGNOSIS — R8290 Unspecified abnormal findings in urine: Secondary | ICD-10-CM | POA: Diagnosis not present

## 2017-08-05 DIAGNOSIS — N3090 Cystitis, unspecified without hematuria: Secondary | ICD-10-CM | POA: Diagnosis not present

## 2017-08-24 DIAGNOSIS — H6590 Unspecified nonsuppurative otitis media, unspecified ear: Secondary | ICD-10-CM | POA: Diagnosis not present

## 2017-10-19 DIAGNOSIS — L0292 Furuncle, unspecified: Secondary | ICD-10-CM | POA: Diagnosis not present

## 2017-10-19 DIAGNOSIS — L732 Hidradenitis suppurativa: Secondary | ICD-10-CM | POA: Diagnosis not present

## 2018-01-20 ENCOUNTER — Other Ambulatory Visit: Payer: Self-pay | Admitting: Cardiovascular Disease

## 2018-03-09 DIAGNOSIS — J069 Acute upper respiratory infection, unspecified: Secondary | ICD-10-CM | POA: Diagnosis not present

## 2018-03-09 DIAGNOSIS — J029 Acute pharyngitis, unspecified: Secondary | ICD-10-CM | POA: Diagnosis not present

## 2018-03-24 DIAGNOSIS — Z1231 Encounter for screening mammogram for malignant neoplasm of breast: Secondary | ICD-10-CM | POA: Diagnosis not present

## 2018-03-30 DIAGNOSIS — R928 Other abnormal and inconclusive findings on diagnostic imaging of breast: Secondary | ICD-10-CM | POA: Diagnosis not present

## 2018-03-30 DIAGNOSIS — N6001 Solitary cyst of right breast: Secondary | ICD-10-CM | POA: Diagnosis not present

## 2018-03-30 DIAGNOSIS — R922 Inconclusive mammogram: Secondary | ICD-10-CM | POA: Diagnosis not present

## 2018-10-28 DIAGNOSIS — R0683 Snoring: Secondary | ICD-10-CM | POA: Diagnosis not present

## 2018-10-28 DIAGNOSIS — R479 Unspecified speech disturbances: Secondary | ICD-10-CM | POA: Diagnosis not present

## 2018-10-28 DIAGNOSIS — R52 Pain, unspecified: Secondary | ICD-10-CM | POA: Diagnosis not present

## 2018-10-28 DIAGNOSIS — G4700 Insomnia, unspecified: Secondary | ICD-10-CM | POA: Diagnosis not present

## 2018-10-28 DIAGNOSIS — R413 Other amnesia: Secondary | ICD-10-CM | POA: Diagnosis not present

## 2018-11-15 DIAGNOSIS — Z01812 Encounter for preprocedural laboratory examination: Secondary | ICD-10-CM | POA: Diagnosis not present

## 2018-11-15 DIAGNOSIS — Z20828 Contact with and (suspected) exposure to other viral communicable diseases: Secondary | ICD-10-CM | POA: Diagnosis not present

## 2018-11-17 DIAGNOSIS — K5090 Crohn's disease, unspecified, without complications: Secondary | ICD-10-CM | POA: Diagnosis not present

## 2018-11-18 DIAGNOSIS — R413 Other amnesia: Secondary | ICD-10-CM | POA: Diagnosis not present

## 2019-01-20 DIAGNOSIS — I10 Essential (primary) hypertension: Secondary | ICD-10-CM | POA: Diagnosis not present

## 2019-01-20 DIAGNOSIS — H5789 Other specified disorders of eye and adnexa: Secondary | ICD-10-CM | POA: Diagnosis not present

## 2019-02-23 DIAGNOSIS — Z006 Encounter for examination for normal comparison and control in clinical research program: Secondary | ICD-10-CM | POA: Diagnosis not present

## 2019-02-23 DIAGNOSIS — Z23 Encounter for immunization: Secondary | ICD-10-CM | POA: Diagnosis not present

## 2019-02-23 DIAGNOSIS — K5090 Crohn's disease, unspecified, without complications: Secondary | ICD-10-CM | POA: Diagnosis not present

## 2019-05-26 DIAGNOSIS — Z006 Encounter for examination for normal comparison and control in clinical research program: Secondary | ICD-10-CM | POA: Diagnosis not present

## 2019-06-16 ENCOUNTER — Telehealth: Payer: Self-pay

## 2019-06-16 ENCOUNTER — Telehealth (INDEPENDENT_AMBULATORY_CARE_PROVIDER_SITE_OTHER): Payer: BC Managed Care – PPO | Admitting: Cardiovascular Disease

## 2019-06-16 ENCOUNTER — Encounter: Payer: Self-pay | Admitting: Cardiovascular Disease

## 2019-06-16 DIAGNOSIS — E668 Other obesity: Secondary | ICD-10-CM

## 2019-06-16 DIAGNOSIS — R072 Precordial pain: Secondary | ICD-10-CM | POA: Diagnosis not present

## 2019-06-16 DIAGNOSIS — K5090 Crohn's disease, unspecified, without complications: Secondary | ICD-10-CM

## 2019-06-16 DIAGNOSIS — E785 Hyperlipidemia, unspecified: Secondary | ICD-10-CM | POA: Diagnosis not present

## 2019-06-16 DIAGNOSIS — R0602 Shortness of breath: Secondary | ICD-10-CM

## 2019-06-16 MED ORDER — METOPROLOL SUCCINATE ER 25 MG PO TB24
25.0000 mg | ORAL_TABLET | Freq: Every day | ORAL | 3 refills | Status: DC
Start: 2019-06-16 — End: 2021-06-19

## 2019-06-16 MED ORDER — METOPROLOL TARTRATE 100 MG PO TABS
ORAL_TABLET | ORAL | 0 refills | Status: DC
Start: 2019-06-16 — End: 2021-06-19

## 2019-06-16 NOTE — Patient Instructions (Addendum)
Medication Instructions:  BEGIN TAKING METOPROLOL SUCCINATE (TOPROL-XL) 25 MG DAILY  ONE TIME DOSE OF METOPROLOL TARTRATE (LOPRESSOR) 100 MG TO BE TAKEN 2 HOURS PRIOR TO YOUR CORONARY CTA STUDY   *If you need a refill on your cardiac medications before your next appointment, please call your pharmacy*   Lab Work: ONE WEEK PRIOR TO CORONARY CTA- BMET  If you have labs (blood work) drawn today and your tests are completely normal, you will receive your results only by: Marland Kitchen MyChart Message (if you have MyChart) OR . A paper copy in the mail If you have any lab test that is abnormal or we need to change your treatment, we will call you to review the results.   Testing/Procedures: Your physician has requested that you have an echocardiogram. Echocardiography is a painless test that uses sound waves to create images of your heart. It provides your doctor with information about the size and shape of your heart and how well your heart's chambers and valves are working. This procedure takes approximately one hour. There are no restrictions for this procedure.  [address]  Coronary CTA-- see below   Follow-Up: At [facility], you and your health needs are our priority.  As part of our continuing mission to provide you with exceptional heart care, we have created designated Provider Care Teams.  These Care Teams include your primary Cardiologist (physician) and Advanced Practice Providers (APPs -  Physician Assistants and Nurse Practitioners) who all work together to provide you with the care you need, when you need it.  We recommend signing up for the patient portal called "MyChart".  Sign up information is provided on this After Visit Summary.  MyChart is used to connect with patients for Virtual Visits (Telemedicine).  Patients are able to view lab/test results, encounter notes, upcoming appointments, etc.  Non-urgent messages can be sent to your provider as well.   To learn more  about what you can do with MyChart, go to [email or web address].    Your next appointment:   4-6 week(s)  The format for your next appointment:   In Person  Provider:   Shelva Majestic, MD   Other Instructions SEE BELOW    Your cardiac CT will be scheduled at one of the below locations:   [facility] [address]  [place], [place] [address] [phone]  If scheduled at [facility], please arrive at the [facility] main entrance of [facility] 30 minutes prior to test start time.  Proceed to the [facility] Radiology Department (first floor) to check-in and test prep. Please follow these instructions carefully (unless otherwise directed):   On the Night Before the Test: . Be sure to Drink plenty of water. . Do not consume any caffeinated/decaffeinated beverages or chocolate 12 hours prior to your test. . Do not take any antihistamines 12 hours prior to your test.  On the Day of the Test: . Drink plenty of water. Do not drink any water within one hour of the test. . Do not eat any food 4 hours prior to the test. . You may take your regular medications prior to the test.  . Take metoprolol (Lopressor) two hours prior to test. . HOLD metoprolol succinate (TOPROL-XL) . HOLD Furosemide/Hydrochlorothiazide morning of the test.-- your losartan/hctz . FEMALES- please wear underwire-free bra if available   *For Clinical Staff only. Please instruct patient the following:*        -Drink plenty of water       -  Hold Metoprolol succinate (toprol- xl) day of test       -Hold Furosemide/hydrochlorothiazide morning of the test- Losartan/HCTZ       -Take metoprolol (Lopressor) 2 hours prior to test (if applicable).                       After the Test: . Drink plenty of water. . After receiving IV contrast, you may experience a mild flushed feeling. This is normal. . On occasion, you may experience a mild rash up to 24 hours after the test. This is  not dangerous. If this occurs, you can take Benadryl 25 mg and increase your fluid intake. . If you experience trouble breathing, this can be serious. If it is severe call 911 IMMEDIATELY. If it is mild, please call our office.   Once we have confirmed authorization from your insurance company, we will call you to set up a date and time for your test.   For non-scheduling related questions, please contact the cardiac imaging nurse navigator should you have any questions/concerns: Marchia Bond, RN Navigator Cardiac Imaging [facility] [phone] office  For scheduling needs, including cancellations and rescheduling, please call [phone].

## 2019-06-16 NOTE — Progress Notes (Signed)
Virtual Visit via Video Note   This visit type was conducted due to national recommendations for restrictions regarding the COVID-19 Pandemic (e.g. social distancing) in an effort to limit this patient's exposure and mitigate transmission in our community.  Due to her co-morbid illnesses, this patient is at least at moderate risk for complications without adequate follow up.  This format is felt to be most appropriate for this patient at this time.  All issues noted in this document were discussed and addressed.  A limited physical exam was performed with this format.  Please refer to the patient's chart for her consent to telehealth for [facility].   The patient was identified using 2 identifiers.  Date:  06/16/2019   ID:  Joan Richardson, DOB [birth year], MRN [id number]  Patient Location: Home Provider Location: Home  PCP:  Orpah Melter, MD  Cardiologist:  Shelva Majestic, MD Electrophysiologist:  None   Evaluation Performed:  Follow-Up Visit  Chief Complaint: 3-year follow-up office visit with me  History of Present Illness:    Joan Richardson is a 54 y.o. female who has a strong family history for premature CAD in multiple family members with several uncles having suffered myocardial infarctions in their late 21s and 36s. Her mother underwent stenting in 2014. The patient has experienced episodes of chest pain which at times are described as sharp and other times as dull. At times there is left arm and neck radiation. She does note shortness of breath with walking. In October 2013 a nuclear perfusion study was normal. .  There is remote tobacco history having started at age 62 but smoked more consistently at age 50. She quit smoking in 1994. She also has a history of hypertension, as well as mild hyperlipidemia. There is a history of Crohn's disease. 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 She's unaware of any African American heritage but she does admit to Joan Richardson  remotely.  In October 2013 an echo Doppler study showed normal systolic function but grade 1 diastolic dysfunction. She had mild MR and mild TR.  On 09/13/2012 she underwent a cardiopulmonary met test. This revealed reduced functional status with maximum oxygen consumption of only 62% of predicted. Her cardiovascular response was impaired that study was considered indeterminate for myocardial dysfunction due to suboptimal peak cardiovascular stress load. She did have a low anaerobic threshold again suggestive of impaired peak cardiac function. There also was evidence for ventilation/perfusion mismatch and possible increased her PFTs revealed normal FEV1 and FEV1/VC. DLCO was normal.  On prior echo, Joan Richardson did have normal systolic function with an ejection fraction greater than 55% and a grade 1 diastolic dysfunction the mitral valve E/A ratio of 0.65.  She developed episodes of chest pain leading to an evaluation at [facility] in [place]. She apparently had a normal chest CT. Cardiac enzymes were negative. Her chest pain was felt most likely non-cardiac. In addition, she tells me she has been followed by hematologist/oncologist for several months of low-grade fever and with the axillary lymph node. She has been anemic.    I recommended that she try to reinstitute statin therapy in light of her strong family history for coronary obstructive disease. She was started on simvastatin 20 mg. Subsequent blood work on 01/14/2013 reveals improvement in her LDL particle member at 1243, down from 1313 and her LDL was reduced from 108-90. Total cholesterol is improved from 170 07/12/1947. HDL particle numbers improved at 33. There still is some  resistance was in insulin resistance scored 65. A Cmet was essentially normal with the exception of mild increased glucose at 104.  Joan Richardson states that she recently had continued to experience some shortness of breath with activity. She also notices some  occasional right eye droop and also some occasional numbness in the right side of her head and face. She also tells me she is now seeing an oncologist Dr. Marilynn Rail at [facility] been found to have sickle cell trait.  She is  in a clinical trial for her Crohn's disease with a kinase inhibitor and is enrolled in the ABT494 study at [facility].  She stopped taking the simvastatin due to myalgias.  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   She was seen by Richardson Dopp in June 2017 and had some vague symptoms of chest pain.  At that time.  An echo Doppler study from 08/17/2015 showed an ejection fraction of 50-55%.  She had normal diastolic parameters and valvular architecture.  She underwent a nuclear stress test which was low risk; ejection fraction 71%.  She had normal perfusion.  I last saw her in March 2018. She continued to participate in experimental drug for her Crohn's disease.  Recently, she states that laboratory done in this study has suggested that her cholesterol has increased.  Specifically, blood work from December 2017 showed total cholesterol 229, LDL cholesterol 136, HDL 63, and triglycerides 154.  She also has been found to have elevation of C-reactive protein.  She has noticed some right neck discomfort, but this seems more related to muscular tension and she also mitts to some occasional jaw discomfort.    I have not seen her since her March 2018 evaluation.  However, she was evaluated in November 2018 by Loma Sousa, PA.  Recently, she has noticed increased weight.  She also has developed episodes of chest pain with heaviness as well as left arm radiation down to her wrist.  For several months he has noticed some shortness of breath with activity.  She also has had some memory issues.  She has continued to participate in her clinical trial for Crohn's disease.  Laboratory from January 2021 as  part of the trial did show increasing LDL cholesterol at 142 with total cholesterol 225, triglycerides 139 and HDL 56.  She presents for evaluation.  The patient does not have symptoms concerning for COVID-19 infection (fever, chills, cough, or new shortness of breath).    Past Medical History:  Diagnosis Date  . Carotid bruit 05/09/08   carotid doppler-normal  . Chest pain    Myoview 10/13: Normal perfusion, no ischemia, EF 85%  . Crohn's disease ([place])   . History of echocardiogram    a. Echo 10/13: Normal LVEF, Gr 1 DD, mild MR, mild TR  //  b. Echo 7/17: EF 55-60%, no RWMA, normal diastolic function  . History of nuclear stress test    a. Myoview 7/17: EF 71%, normal perfusion, low risk study  . Hyperlipidemia   . Hypertension 12/10/11   Echo- normal dystolic function grade 1 diastolic dysfuction. Mitral Valve E to A ratio is 0.65. evidence for mild MR and mild TR without other significant abnormalities.  . Palpitations 12/22/11  . Sickle cell trait ([place])    No past surgical history on file.   Current Meds  Medication Sig  . ALPRAZolam (XANAX) 0.5 MG tablet Take 0.5 mg  by mouth as needed.  . ezetimibe (ZETIA) 10 MG tablet TAKE 1 TABLET (10 MG TOTAL) BY MOUTH DAILY.  Marland Kitchen gabapentin (NEURONTIN) 100 MG capsule 1 cap at bedtime, if well tolerated inc to 2-3 caps to help sleep quality/ pain symptoms.  . Investigational - Study Medication Take 15 mg by mouth daily. Study name: ABT [id number] Additional study details:Chron's disease  . losartan-hydrochlorothiazide (HYZAAR) 50-12.5 MG tablet TAKE 1 TABLET BY MOUTH EVERY DAY  . [DISCONTINUED] gabapentin (NEURONTIN) 100 MG capsule Take 100 mg by mouth 3 (three) times daily. Can take 2-3 caps at bed time     Allergies:   Imuran [azathioprine], Infed [iron dextran], Lialda [mesalamine], Septra [sulfamethoxazole-trimethoprim], Sulfa antibiotics, and Sulfamethoxazole-trimethoprim   Social History   Tobacco Use  . Smoking status: Former Smoker     Years: 13.00    Types: Cigarettes    Quit date: 05/11/1992    Years since quitting: 27.1  . Smokeless tobacco: Never Used  . Tobacco comment: April 1994  Substance Use Topics  . Alcohol use: No  . Drug use: No     Family Hx: The patient's family history includes Arrhythmia in her sister; COPD in her father; Cancer - Other in her paternal grandmother; Emphysema in her brother; Heart attack (age of onset: 49) in her mother; Hyperlipidemia in her mother; Hypertension in her father; Stroke in her maternal grandfather.  ROS:   Please see the history of present illness.    No fevers chills night sweats No cough Shortness of breath with activity Chest heaviness with left arm radiation down to rest No palpitations Crohn's disease significantly improved with her experimental drug treatment Recent increase lipid studies No edema Sleeping well All other systems reviewed and are negative.   Prior CV studies:   The following studies were reviewed today:  Nuclear perfusion study August 17, 2015: Study Highlights   Nuclear stress EF: 71%.  There was no ST segment deviation noted during stress.  The study is normal.  This is a low risk study.  The left ventricular ejection fraction is hyperdynamic (>65%).     Echo Doppler study August 17, 2015: Study Conclusions  - Left ventricle: The cavity size was normal. Systolic function was  normal. The estimated ejection fraction was in the range of 55%  to 60%. Wall motion was normal; there were no regional wall  motion abnormalities. Left ventricular diastolic function  parameters were normal.  - Atrial septum: No defect or patent foramen ovale was identified.   Labs/Other Tests and Data Reviewed:    EKG: I personally reviewed her last ECG from December 23, 2016 which revealed normal sinus rhythm at 86 bpm without significant ST-T changes.  Normal intervals.  Recent Labs: No results found for requested labs within last 8760  hours.   Recent Lipid Panel Lab Results  Component Value Date/Time   CHOL 149 01/14/2013 09:22 AM   TRIG 73 01/14/2013 09:22 AM   HDL 44 01/14/2013 09:22 AM   LDLCALC 90 01/14/2013 09:22 AM    Wt Readings from Last 3 Encounters:  06/16/19 195 lb (88.5 kg)  12/23/16 188 lb (85.3 kg)  04/17/16 184 lb 12.8 oz (83.8 kg)     Objective:    Vital Signs:  BP 130/84   Ht 4' 11.75" (1.518 m)   Wt 195 lb (88.5 kg)   BMI 38.40 kg/m    Since this was a virtual visit I could not physically examine the patient. During his video visit she  was well-developed well-nourished no acute distress. Breathing was normal and not labored There was no wheezing She was in good spirits There was no active chest pain but she describes her episodic chest heaviness with radiation to her left arm down to her recent breast No awareness of palpitations No awareness of swelling No abdominal pain: Crohn's systems have dramatically improved with her investigational drug Normal affect and mood  ASSESSMENT & PLAN:    1. Chest discomfort: This case describes recent development of chest heaviness which typically is nonexertional.  However she does admit to left arm radiation radiating to her wrist.  Remotely she had undergone a nuclear perfusion study in 2017 which was normal.  I have suggested she undergo a coronary CT a for further evaluation of this chest discomfort to assess for coronary plaque. 2. Exertional shortness of breath: I will schedule her for follow-up echo Doppler study for reassessment of systolic and diastolic function. 3. Hyperlipidemia: She has been on experimental drug and has been off statin therapy.  Her LDL cholesterol has risen to 142 in January 2021.  Depending on her coronary CTA she may need to reinstitute lipid-lowering therapy, but has been off it due to her investigational drug. 4. Tachycardia: She states her resting pulse rate typically runs between 92 and 104.  I am adding Toprol-XL 25  mg to her regimen which will be helpful for heart rate control, optimal blood pressure as well as potential ischemic benefit. 5. Moderate obesity: BMI 38.  Weight loss was recommended.  Discussed optimal heart healthy diet and potentially exercise for at least 30 minutes 5 days/week if at all possible. 6. Crohn's disease: Followed at [facility] and is on investigational drug 5 years  COVID-19 Education: The signs and symptoms of COVID-19 were discussed with the patient and how to seek care for testing (follow up with PCP or arrange E-visit).  The importance of social distancing was discussed today.  Time:   Today, I have spent 22 minutes with the patient with telehealth technology discussing the above problems.     Medication Adjustments/Labs and Tests Ordered: Current medicines are reviewed at length with the patient today.  Concerns regarding medicines are outlined above.   Tests Ordered: No orders of the defined types were placed in this encounter.   Medication Changes: No orders of the defined types were placed in this encounter.   Follow Up: She will be scheduled for an echo Doppler study with coronary CTA.  I will see her back in the office in person in 4 to 6 weeks  Signed, Shelva Majestic, MD  06/16/2019 9:19 AM    [place]

## 2019-06-16 NOTE — Telephone Encounter (Signed)
Attempted to call pt to review avs and cta instructions. Unable to leave a vm due to mailbox being full. Will call back later

## 2019-06-18 ENCOUNTER — Encounter: Payer: Self-pay | Admitting: Cardiovascular Disease

## 2019-06-29 DIAGNOSIS — H6982 Other specified disorders of Eustachian tube, left ear: Secondary | ICD-10-CM | POA: Diagnosis not present

## 2019-06-29 DIAGNOSIS — G510 Bell's palsy: Secondary | ICD-10-CM | POA: Diagnosis not present

## 2019-06-29 DIAGNOSIS — R42 Dizziness and giddiness: Secondary | ICD-10-CM | POA: Diagnosis not present

## 2019-07-07 ENCOUNTER — Ambulatory Visit (HOSPITAL_COMMUNITY): Payer: BC Managed Care – PPO | Attending: Cardiology

## 2019-07-07 DIAGNOSIS — R072 Precordial pain: Secondary | ICD-10-CM | POA: Insufficient documentation

## 2019-07-09 DIAGNOSIS — R413 Other amnesia: Secondary | ICD-10-CM | POA: Diagnosis not present

## 2019-07-09 DIAGNOSIS — R930 Abnormal findings on diagnostic imaging of skull and head, not elsewhere classified: Secondary | ICD-10-CM | POA: Diagnosis not present

## 2019-07-18 ENCOUNTER — Telehealth (HOSPITAL_COMMUNITY): Payer: Self-pay | Admitting: *Deleted

## 2019-07-18 NOTE — Telephone Encounter (Signed)

## 2019-07-20 ENCOUNTER — Ambulatory Visit (HOSPITAL_COMMUNITY)
Admission: RE | Admit: 2019-07-20 | Discharge: 2019-07-20 | Disposition: A | Discharge status: Home / Self Care | Payer: BC Managed Care – PPO | Source: Ambulatory Visit | Attending: Cardiovascular Disease | Admitting: Cardiovascular Disease

## 2019-07-20 ENCOUNTER — Other Ambulatory Visit: Payer: Self-pay

## 2019-07-20 DIAGNOSIS — R072 Precordial pain: Secondary | ICD-10-CM | POA: Diagnosis not present

## 2019-07-20 MED ORDER — NITROGLYCERIN 0.4 MG SL SUBL
0.8000 mg | SUBLINGUAL_TABLET | Freq: Once | SUBLINGUAL | Status: AC
  Administered 2019-07-20: 0.8 mg via SUBLINGUAL

## 2019-07-20 MED ORDER — NITROGLYCERIN 0.4 MG SL SUBL
SUBLINGUAL_TABLET | SUBLINGUAL | Status: AC
  Filled 2019-07-20: qty 2

## 2019-07-20 MED ORDER — IOHEXOL 350 MG/ML SOLN
80.0000 mL | Freq: Once | INTRAVENOUS | Status: AC | PRN
  Administered 2019-07-20: 80 mL via INTRAVENOUS

## 2019-07-21 DIAGNOSIS — R072 Precordial pain: Secondary | ICD-10-CM | POA: Diagnosis not present

## 2019-09-29 DIAGNOSIS — Z20822 Contact with and (suspected) exposure to covid-19: Secondary | ICD-10-CM | POA: Diagnosis not present

## 2019-09-29 DIAGNOSIS — Z03818 Encounter for observation for suspected exposure to other biological agents ruled out: Secondary | ICD-10-CM | POA: Diagnosis not present

## 2019-10-16 DIAGNOSIS — Z20822 Contact with and (suspected) exposure to covid-19: Secondary | ICD-10-CM | POA: Diagnosis not present

## 2019-10-16 DIAGNOSIS — R0602 Shortness of breath: Secondary | ICD-10-CM | POA: Diagnosis not present

## 2019-10-21 DIAGNOSIS — Z20822 Contact with and (suspected) exposure to covid-19: Secondary | ICD-10-CM | POA: Diagnosis not present

## 2019-10-21 DIAGNOSIS — J019 Acute sinusitis, unspecified: Secondary | ICD-10-CM | POA: Diagnosis not present

## 2019-11-03 DIAGNOSIS — R072 Precordial pain: Secondary | ICD-10-CM | POA: Diagnosis not present

## 2019-11-04 LAB — BASIC METABOLIC PANEL
BUN/Creatinine Ratio: 14 (ref 9–23)
BUN: 11 mg/dL (ref 6–24)
CO2: 21 mmol/L (ref 20–29)
Calcium: 9.1 mg/dL (ref 8.7–10.2)
Chloride: 104 mmol/L (ref 96–106)
Creatinine, Ser: 0.79 mg/dL (ref 0.57–1.00)
GFR calc Af Amer: 99 mL/min/{1.73_m2} (ref >59)
GFR calc non Af Amer: 86 mL/min/{1.73_m2} (ref >59)
Glucose: 122 mg/dL — ABNORMAL HIGH (ref 65–99)
Potassium: 4.3 mmol/L (ref 3.5–5.2)
Sodium: 141 mmol/L (ref 134–144)

## 2020-04-26 DIAGNOSIS — I10 Essential (primary) hypertension: Secondary | ICD-10-CM | POA: Diagnosis not present

## 2020-04-26 DIAGNOSIS — I700 Atherosclerosis of aorta: Secondary | ICD-10-CM | POA: Diagnosis not present

## 2020-04-26 DIAGNOSIS — L739 Follicular disorder, unspecified: Secondary | ICD-10-CM | POA: Diagnosis not present

## 2020-04-26 DIAGNOSIS — H109 Unspecified conjunctivitis: Secondary | ICD-10-CM | POA: Diagnosis not present

## 2021-06-19 ENCOUNTER — Encounter: Payer: Self-pay | Admitting: Cardiovascular Disease

## 2021-06-19 ENCOUNTER — Ambulatory Visit (INDEPENDENT_AMBULATORY_CARE_PROVIDER_SITE_OTHER): Payer: BC Managed Care – PPO | Admitting: Cardiovascular Disease

## 2021-06-19 VITALS — BP 144/82 | HR 84 | Ht 60.0 in | Wt 208.6 lb

## 2021-06-19 DIAGNOSIS — F32A Depression, unspecified: Secondary | ICD-10-CM

## 2021-06-19 DIAGNOSIS — I10 Essential (primary) hypertension: Secondary | ICD-10-CM | POA: Diagnosis not present

## 2021-06-19 DIAGNOSIS — E785 Hyperlipidemia, unspecified: Secondary | ICD-10-CM

## 2021-06-19 DIAGNOSIS — Z8249 Family history of ischemic heart disease and other diseases of the circulatory system: Secondary | ICD-10-CM

## 2021-06-19 DIAGNOSIS — R0602 Shortness of breath: Secondary | ICD-10-CM

## 2021-06-19 DIAGNOSIS — R072 Precordial pain: Secondary | ICD-10-CM | POA: Diagnosis not present

## 2021-06-19 DIAGNOSIS — R0683 Snoring: Secondary | ICD-10-CM

## 2021-06-19 MED ORDER — LOSARTAN POTASSIUM-HCTZ 50-12.5 MG PO TABS: 1.0000 | ORAL_TABLET | Freq: Every day | ORAL | 3 refills | Status: DC

## 2021-06-19 MED ORDER — METOPROLOL TARTRATE 100 MG PO TABS: ORAL_TABLET | ORAL | 0 refills | Status: AC

## 2021-06-19 NOTE — Patient Instructions (Addendum)
Medication Instructions:  ?Continue same medications ?*If you need a refill on your cardiac medications before your next appointment, please call your pharmacy* ? ? ?Lab Work: cmet,cbc,lipid panel,LPa  today ? ? ? ?Testing/Procedures: ?Echo ? ?Sleep Study ? ?Coronary CT  will be scheduled after approved by insurance  Follow instructions below ? ?Follow-Up: ?At [facility], you and your health needs are our priority.  As part of our continuing mission to provide you with exceptional heart care, we have created designated Provider Care Teams.  These Care Teams include your primary Cardiologist (physician) and Advanced Practice Providers (APPs -  Physician Assistants and Nurse Practitioners) who all work together to provide you with the care you need, when you need it. ? ?We recommend signing up for the patient portal called "MyChart".  Sign up information is provided on this After Visit Summary.  MyChart is used to connect with patients for Virtual Visits (Telemedicine).  Patients are able to view lab/test results, encounter notes, upcoming appointments, etc.  Non-urgent messages can be sent to your provider as well.   ?To learn more about what you can do with MyChart, go to [email or web address].   ? ?Your next appointment:   ?  ? ?The format for your next appointment: Office ? ? ?Provider:  Dr.Kelly ? ? ? ? ? ?Your cardiac CT will be scheduled at one of the below locations:  ? ?[facility] ?[address] ?[place], [place] [address] ?[phone] ? ?OR ? ?[facility] ?[address] ?[address] ?[place], [place] [address] ?[phone] ? ?If scheduled at [facility], please arrive at the [facility] Entrance (Entrance C2) of [facility] 30 minutes prior to test start time. ?You can use the FREE valet parking offered at entrance C (encouraged to control the heart rate for the test)  ?Proceed to the [facility] Radiology Department (first  floor) to check-in and test prep. ? ?All radiology patients and guests should use entrance C2 at [facility], accessed from [facility], even though the hospital's physical address listed is [address] ? ? ? ?If scheduled at [facility], please arrive 15 mins early for check-in and test prep. ? ?Please follow these instructions carefully (unless otherwise directed): ? ? ? ?On the Night Before the Test: ?Be sure to Drink plenty of water. ?Do not consume any caffeinated/decaffeinated beverages or chocolate 12 hours prior to your test. ?Do not take any antihistamines 12 hours prior to your test. ? ? ?On the Day of the Test: ?Drink plenty of water until 1 hour prior to the test. ?Do not eat any food 4 hours prior to the test. ?You may take your regular medications prior to the test.  ?Take metoprolol 100 mg two hours prior to test. ?HOLD Losartan/Hydrochlorothiazide morning of the test. ?FEMALES- please wear underwire-free bra if available, avoid dresses & tight clothing ? ? ? ?     ?After the Test: ?Drink plenty of water. ?After receiving IV contrast, you may experience a mild flushed feeling. This is normal. ?On occasion, you may experience a mild rash up to 24 hours after the test. This is not dangerous. If this occurs, you can take Benadryl 25 mg and increase your fluid intake. ?If you experience trouble breathing, this can be serious. If it is severe call 911 IMMEDIATELY. If it is mild, please call our office. ? ?We will call to schedule your test 2-4 weeks out  understanding that some insurance companies will need an authorization prior to the service being performed.  ? ?For non-scheduling related questions, please contact the cardiac imaging nurse navigator should you have any questions/concerns: ?Marchia Bond, Cardiac Imaging Nurse Navigator ?Gordy Clement, Cardiac Imaging Nurse Navigator ?[facility] ?Direct Office Dial:  [phone]  ? ?For scheduling needs, including cancellations and rescheduling, please call [place], [phone]. ? ? ? ?Important Information About Sugar ? ? ? ? ? ? ?

## 2021-06-19 NOTE — Progress Notes (Signed)
Cardiology Office Note    Date:  06/29/2021   ID:  Joan Joan, DOB Jun 10, 1965, MRN [id number]  PCP:  Joan Melter, MD  Cardiologist:  Joan Majestic, MD   2-year follow-up evaluation  History of Present Illness:  Joan Joan is a 56 y.o. female who has a strong family history for premature CAD in multiple family members with several uncles having suffered myocardial infarctions in their late 78s and 53s. Her mother underwent stenting in 2014. She experienced episodes of chest pain which at times are described as sharp and other times as dull. At times there is left arm and neck radiation. She does note shortness of breath with walking. In October 2013 a nuclear perfusion study was normal. .   There is remote tobacco history having started at age 22 but smoked more consistently at age 70. She quit smoking in 1994. She also has a history of hypertension, as well as mild hyperlipidemia. There is a history of Crohn's disease. 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 She's unaware of any African American heritage but she does admit to [name] remotely.   In October 2013 an echo Doppler study showed normal systolic function but grade 1 diastolic dysfunction. She had mild MR and mild TR.   On 09/13/2012 she underwent a cardiopulmonary met test. This revealed reduced functional status with maximum oxygen consumption of only 62% of predicted. Her cardiovascular response was impaired that study was considered indeterminate for myocardial dysfunction due to suboptimal peak cardiovascular stress load. She did have a low anaerobic threshold again suggestive of impaired peak cardiac function. There also was evidence for ventilation/perfusion mismatch and possible increased her PFTs revealed normal FEV1 and FEV1/VC. DLCO was normal.   On prior echo, Joan Joan did have normal systolic function with an ejection fraction greater than 55% and a grade 1 diastolic dysfunction the mitral valve E/A ratio  of 0.65.   She developed episodes of chest pain leading to an evaluation at [facility] in [place]. She had a normal chest CT. Cardiac enzymes were negative. Her chest pain was felt most likely non-cardiac. In addition, she tells me she has been followed by hematologist/oncologist for several months of low-grade fever and with the axillary lymph node. She has been anemic.    I recommended that she try to reinstitute statin therapy in light of her strong family history for coronary obstructive disease. She was started on simvastatin 20 mg. Subsequent blood work on 01/14/2013 reveals improvement in her LDL particle member at 1243, down from 1313 and her LDL was reduced from 108-90. Total cholesterol is improved from 170 07/12/1947. HDL particle numbers improved at 33. There still is some resistance was in insulin resistance scored 65. A Cmet was essentially normal with the exception of mild increased glucose at 104.   Joan Joan states that she recently had continued to experience some shortness of breath with activity. She also notices some occasional right eye droop and also some occasional numbness in the right side of her head and face. She also tells me she is now seeing an oncologist Dr. Marilynn Richardson at [facility] been found to have sickle cell trait.   She is  in a clinical trial for her Crohn's disease with a kinase inhibitor and is enrolled in the ABT494 study at [facility].   She stopped taking the simvastatin due to myalgias.  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    She was seen by Joan Joan in June 2017 and had some vague symptoms of chest pain.  At that time.  An echo Doppler study from 08/17/2015 showed an ejection fraction of 50-55%.  She had normal diastolic parameters and valvular architecture.  She underwent a nuclear stress test which was low risk; ejection fraction 71%.  She had  normal perfusion.   I saw her in March 2018. She continued to participate in experimental drug for her Crohn's disease.  Recently, she states that laboratory done in this study has suggested that her cholesterol has increased.  Specifically, blood work from December 2017 showed total cholesterol 229, LDL cholesterol 136, HDL 63, and triglycerides 154.  She also has been found to have elevation of C-reactive protein.  She has noticed some right neck discomfort, but this seems more related to muscular tension and she also mitts to some occasional jaw discomfort.     She was evaluated in November 2018 by Joan Deforest, PA.   I last evaluated her in a telemedicine visit on Jun 16, 2019.  She had recently noticed increased weight gain and had developed some episodes of chest discomfort with left arm radiation down to her wrist.  For several months she has noticed some shortness of breath with activity.  She also has had some memory issues.  She has continued to participate in her clinical trial for Crohn's disease.  Laboratory from January 2021 as part of the trial did show increasing LDL cholesterol at 142 with total cholesterol 225, triglycerides 139 and HDL 56.   I recommended that she undergo a 2D echo Doppler study which was done on Jul 07, 2019 and showed hyperdynamic LV function with EF of 70 to 75%.  In addition, she was referred for CT coronary angiography which was done on July 20, 2019 which showed a calcium score of 22 placing her in the 89th percentile for age and sex matched control.  There was mild less than 25% mixed plaque in the left main, 50 to 69% in the LAD with no detectable plaque in the RCA or circumflex.  FFR analysis was negative.  Since I last saw her, she admits to significant weight gain particularly during the [place] pandemic.  She admits to approximately 50 pound weight gain over 6 years.  At times she does experience some nonexertional atypical chest pain and at times admits to exertional  shortness of breath.  She admits to being under significant increased stress with her father dying the day after Christmas at age 65.  She continues to be evaluated at [facility] for Crohn's disease.  She admits to snoring and nonrestorative sleep.  She is on losartan HCT 50/12.5 mg daily for hypertension.  She is no longer taking metoprolol, rosuvastatin or Zetia.  She is on sertraline and amitriptyline.  She presents for evaluation.    Past Medical History:  Diagnosis Date   Carotid bruit 05/09/08   carotid doppler-normal   Chest pain    Myoview 10/13: Normal perfusion, no ischemia, EF 85%   Crohn's disease ([place])    History of echocardiogram    a. Echo 10/13: Normal LVEF, Gr 1 DD, mild MR, mild TR  //  b. Echo 7/17: EF 55-60%, no RWMA, normal diastolic function   History of nuclear stress test    a. Myoview 7/17: EF 71%, normal perfusion, low risk study   Hyperlipidemia    Hypertension 12/10/11  Echo- normal dystolic function grade 1 diastolic dysfuction. Mitral Valve E to A ratio is 0.65. evidence for mild MR and mild TR without other significant abnormalities.   Palpitations 12/22/11   Sickle cell trait ([place])     No past surgical history on file.  Current Medications: Outpatient Medications Prior to Visit  Medication Sig Dispense Refill   amitriptyline (ELAVIL) 10 MG tablet 10 mg daily.     Investigational - Study Medication Take 15 mg by mouth daily. Study name: ABT 494 Additional study details:Chron's disease     sertraline (ZOLOFT) 50 MG tablet Take by mouth.     Cholecalciferol (VITAMIN D3) 2000 UNITS TABS Take 1 tablet by mouth daily.     Cyanocobalamin (VITAMIN B-12 SL) Place 2,500 mcg under the tongue daily.     losartan-hydrochlorothiazide (HYZAAR) 50-12.5 MG tablet TAKE 1 TABLET BY MOUTH EVERY DAY 30 tablet 1   baclofen (LIORESAL) 10 MG tablet Take 10 mg by mouth as needed for muscle spasms. (Patient not taking: Reported on 06/19/2021)     ALPRAZolam (XANAX) 0.5 MG  tablet Take 0.5 mg by mouth as needed. (Patient not taking: Reported on 06/19/2021)     clidinium-chlordiazePOXIDE (LIBRAX) 2.5-5 MG per capsule Take 1 capsule by mouth daily as needed (FOR SPASMS).  (Patient not taking: Reported on 06/19/2021)     diazepam (DIASTAT) 2.5 MG GEL Place 2.5 mg rectally as needed for seizure. (Patient not taking: Reported on 06/19/2021)     ezetimibe (ZETIA) 10 MG tablet TAKE 1 TABLET (10 MG TOTAL) BY MOUTH DAILY. (Patient not taking: Reported on 06/19/2021) 90 tablet 2   gabapentin (NEURONTIN) 100 MG capsule 1 cap at bedtime, if well tolerated inc to 2-3 caps to help sleep quality/ pain symptoms. (Patient not taking: Reported on 06/19/2021)     hyoscyamine (LEVBID) 0.375 MG 12 hr tablet Take 0.375 mg by mouth every 12 (twelve) hours as needed for cramping.  (Patient not taking: Reported on 06/19/2021)     metoprolol succinate (TOPROL XL) 25 MG 24 hr tablet Take 1 tablet (25 mg total) by mouth daily. (Patient not taking: Reported on 06/19/2021) 90 tablet 3   metoprolol tartrate (LOPRESSOR) 100 MG tablet ONE TIME DOSE: Take 2 hours prior to coronary CTA study 1 tablet 0   metoprolol tartrate (LOPRESSOR) 25 MG tablet Take 0.5 tablets (12.5 mg total) by mouth 2 (two) times daily. (Patient not taking: Reported on 06/19/2021) 90 tablet 3   pantoprazole (PROTONIX) 40 MG tablet Take 1 tablet by mouth daily as needed (HEARTBURN).  (Patient not taking: Reported on 06/19/2021)     rosuvastatin (CRESTOR) 10 MG tablet Take 1 tablet (10 mg total) daily by mouth. (Patient not taking: Reported on 06/19/2021) 90 tablet 3   traMADol (ULTRAM) 50 MG tablet Take 50 mg by mouth every 6 (six) hours as needed for pain. (Patient not taking: Reported on 06/19/2021)     No facility-administered medications prior to visit.     Allergies:   Sulfa antibiotics, Imuran [azathioprine], Infed [iron dextran], Lialda [mesalamine], Septra [sulfamethoxazole-trimethoprim], and Sulfamethoxazole-trimethoprim   Social  History   Socioeconomic History   Marital status: Married    Spouse name: Not on file   Number of children: Not on file   Years of education: Not on file   Highest education level: Not on file  Occupational History   Not on file  Tobacco Use   Smoking status: Former    Years: 13.00    Types: Cigarettes  Quit date: 05/11/1992    Years since quitting: 29.1   Smokeless tobacco: Never   Tobacco comments:    April 1994  Substance and Sexual Activity   Alcohol use: No   Drug use: No   Sexual activity: Yes  Other Topics Concern   Not on file  Social History Narrative   Not on file   Social Determinants of Health   Financial Resource Strain: Not on file  Food Insecurity: Not on file  Transportation Needs: Not on file  Physical Activity: Not on file  Stress: Not on file  Social Connections: Not on file     Family History:  The patient's family history includes Arrhythmia in her sister; COPD in her father; Cancer - Other in her paternal grandmother; Emphysema in her brother; Heart attack (age of onset: 68) in her mother; Hyperlipidemia in her mother; Hypertension in her father; Stroke in her maternal grandfather.  Her father died the day after Christmas 2022.  ROS General: Negative; No fevers, chills, or night sweats;  HEENT: Negative; No changes in vision or hearing, sinus congestion, difficulty swallowing Pulmonary: Negative; No cough, wheezing, shortness of breath, hemoptysis Cardiovascular: Negative; No chest pain, presyncope, syncope, palpitations GI: Negative; No nausea, vomiting, diarrhea, or abdominal pain GU: Negative; No dysuria, hematuria, or difficulty voiding Musculoskeletal: Negative; no myalgias, joint pain, or weakness Hematologic/Oncology: Negative; no easy bruising, bleeding Endocrine: Negative; no heat/cold intolerance; no diabetes Neuro: Negative; no changes in balance, headaches Skin: Negative; No rashes or skin lesions Psychiatric: Negative; No  behavioral problems, depression Sleep: Negative; No snoring, daytime sleepiness, hypersomnolence, bruxism, restless legs, hypnogognic hallucinations, no cataplexy Other comprehensive 14 point system review is negative.   PHYSICAL EXAM:   VS:  BP (!) 144/82   Pulse 84   Ht 5' (1.524 m)   Wt 208 lb 9.6 oz (94.6 kg)   SpO2 94%   BMI 40.74 kg/m     Repeat blood pressure by me was 122/76.  Wt Readings from Last 3 Encounters:  06/19/21 208 lb 9.6 oz (94.6 kg)  06/16/19 195 lb (88.5 kg)  12/23/16 188 lb (85.3 kg)    General: Alert, oriented, no distress.  Skin: normal turgor, no rashes, warm and dry HEENT: Normocephalic, atraumatic. Pupils equal round and reactive to light; sclera anicteric; extraocular muscles intact;  Nose without nasal septal hypertrophy Mouth/Parynx benign; Mallinpatti scale 3 Neck: No JVD, no carotid bruits; normal carotid upstroke Lungs: clear to ausculatation and percussion; no wheezing or rales Chest wall: without tenderness to palpitation Heart: PMI not displaced, RRR, s1 s2 normal, 1/6 systolic murmur, no diastolic murmur, no rubs, gallops, thrills, or heaves Abdomen: soft, nontender; no hepatosplenomehaly, BS+; abdominal aorta nontender and not dilated by palpation. Back: no CVA tenderness Pulses 2+ Musculoskeletal: full range of motion, normal strength, no joint deformities Extremities: no clubbing cyanosis or edema, Homan's sign negative  Neurologic: grossly nonfocal; Cranial nerves grossly wnl Psychologic: Normal mood and affect   Studies/Labs Reviewed:   Jun 19, 2021 ECG (independently read by me): NSR at 84, no ST changes, no ectoopy  Recent Labs:    Latest Ref Rng & Units 06/19/2021    9:57 AM 11/03/2019    8:13 AM 12/23/2016   11:06 AM  BMP  Glucose 70 - 99 mg/dL 117   122   103    BUN 6 - 24 mg/dL 13   11   10     Creatinine 0.57 - 1.00 mg/dL 0.78   0.79   0.80  BUN/Creat Ratio 9 - 23 17   14   13     Sodium 134 - 144 mmol/L 139   141    140    Potassium 3.5 - 5.2 mmol/L 4.6   4.3   4.8    Chloride 96 - 106 mmol/L 101   104   99    CO2 20 - 29 mmol/L 25   21   23     Calcium 8.7 - 10.2 mg/dL 9.3   9.1   9.8          Latest Ref Rng & Units 06/19/2021    9:57 AM 01/14/2013    9:22 AM 08/18/2012   11:29 AM  Hepatic Function  Total Protein 6.0 - 8.5 g/dL 7.5   7.6   7.2    Albumin 3.8 - 4.9 g/dL 4.3   4.1   4.4    AST 0 - 40 IU/L 22   18   23     ALT 0 - 32 IU/L 18   12   19     Alk Phosphatase 44 - 121 IU/L 131   109   123    Total Bilirubin 0.0 - 1.2 mg/dL 0.3   0.6   0.5         Latest Ref Rng & Units 06/19/2021    9:57 AM 08/18/2012   11:29 AM  CBC  WBC 3.4 - 10.8 x10E3/uL 11.7   11.5    Hemoglobin 11.1 - 15.9 g/dL 13.1   12.9    Hematocrit 34.0 - 46.6 % 38.7   39.1    Platelets 150 - 450 x10E3/uL 421   389     Lab Results  Component Value Date   MCV 85 06/19/2021   MCV 80.6 08/18/2012   Lab Results  Component Value Date   TSH 2.144 08/18/2012   No results found for: HGBA1C   BNP    Component Value Date/Time   BNP <4.0 08/02/2015 0935    ProBNP No results found for: PROBNP   Lipid Panel     Component Value Date/Time   CHOL 212 (H) 06/19/2021 0957   CHOL 149 01/14/2013 0922   TRIG 116 06/19/2021 0957   TRIG 73 01/14/2013 0922   HDL 60 06/19/2021 0957   HDL 44 01/14/2013 0922   CHOLHDL 3.5 06/19/2021 0957   LDLCALC 131 (H) 06/19/2021 0957   LDLCALC 90 01/14/2013 0922   LABVLDL 21 06/19/2021 0957     RADIOLOGY: ECHOCARDIOGRAM COMPLETE  Result Date: 06/27/2021    ECHOCARDIOGRAM REPORT   Patient Name:   Joan Joan Date of Exam: 06/27/2021 Medical Rec #:  [id number]   Height:       60.0 in Accession #:    [id number]  Weight:       208.6 lb Date of Birth:  1965/08/07   BSA:          1.900 m Patient Age:    65 years    BP:           145/80 mmHg Patient Gender: F           HR:           91 bpm. Exam Location:  Outpatient Procedure: 2D Echo, Color Doppler, Cardiac Doppler and Intracardiac             Opacification Agent Indications:    R06.9 DOE; R60.0 Lower extremity edema; R07.2 Precordial pain  History:  Patient has prior history of Echocardiogram examinations, most                 recent 07/07/2019. Arrythmias:LBBB, Signs/Symptoms:Chest Pain,                 Dyspnea and Edema; Risk Factors:Hypertension, Dyslipidemia,                 Former Smoker and Family History of Coronary Artery Disease.                 Patient has had chest pain, DOE and edema in all extremities.                 Patient contracted CoVid-19 x 2, most recently 02/2021. Patient                 has sickle cell trait.  Sonographer:    Salvadore Dom RVT, RDCS (AE), RDMS Referring Phys: [id number] Kimberlly Norgard A Kalley Nicholl  Sonographer Comments: Suboptimal apical window and patient is morbidly obese. IMPRESSIONS  1. Left ventricular ejection fraction, by estimation, is 70 to 75%. The left ventricle has hyperdynamic function. The left ventricle has no regional wall motion abnormalities. Left ventricular diastolic parameters were normal.  2. Right ventricular systolic function is normal. The right ventricular size is normal.  3. The mitral valve is normal in structure. No evidence of mitral valve regurgitation. No evidence of mitral stenosis.  4. The aortic valve is tricuspid. Aortic valve regurgitation is not visualized. No aortic stenosis is present.  5. The inferior vena cava is normal in size with greater than 50% respiratory variability, suggesting right atrial pressure of 3 mmHg. Comparison(s): EF 70%. Conclusion(s)/Recommendation(s): Normal biventricular function without evidence of hemodynamically significant valvular heart disease. FINDINGS  Left Ventricle: Left ventricular ejection fraction, by estimation, is 70 to 75%. The left ventricle has hyperdynamic function. The left ventricle has no regional wall motion abnormalities. Definity contrast agent was given IV to delineate the left ventricular endocardial borders. The left ventricular internal  cavity size was normal in size. There is no left ventricular hypertrophy. Left ventricular diastolic parameters were normal. Right Ventricle: The right ventricular size is normal. Right ventricular systolic function is normal. Left Atrium: Left atrial size was normal in size. Right Atrium: Right atrial size was normal in size. Pericardium: There is no evidence of pericardial effusion. Mitral Valve: The mitral valve is normal in structure. No evidence of mitral valve regurgitation. No evidence of mitral valve stenosis. Tricuspid Valve: The tricuspid valve is normal in structure. Tricuspid valve regurgitation is not demonstrated. No evidence of tricuspid stenosis. Aortic Valve: The aortic valve is tricuspid. Aortic valve regurgitation is not visualized. No aortic stenosis is present. Aortic valve mean gradient measures 5.0 mmHg. Aortic valve peak gradient measures 8.1 mmHg. Pulmonic Valve: The pulmonic valve was normal in structure. Pulmonic valve regurgitation is not visualized. No evidence of pulmonic stenosis. Aorta: The aortic root is normal in size and structure. Venous: The inferior vena cava is normal in size with greater than 50% respiratory variability, suggesting right atrial pressure of 3 mmHg. IAS/Shunts: No atrial level shunt detected by color flow Doppler.  LEFT VENTRICLE PLAX 2D LVIDd:         3.10 cm     Diastology LVIDs:         1.51 cm     LV e' medial:    8.81 cm/s LV PW:         0.85 cm  LV E/e' medial:  11.7 LV IVS:        0.89 cm     LV e' lateral:   9.90 cm/s LVOT diam:     1.70 cm     LV E/e' lateral: 10.4 LV SV:         54 LV SV Index:   28 LVOT Area:     2.27 cm                             3D Volume EF: LV Volumes (MOD)           3D EF:        70 % LV vol d, MOD A2C: 89.6 ml LV EDV:       72 ml LV vol d, MOD A4C: 77.6 ml LV ESV:       22 ml LV vol s, MOD A2C: 28.9 ml LV SV:        50 ml LV vol s, MOD A4C: 19.6 ml LV SV MOD A2C:     60.7 ml LV SV MOD A4C:     77.6 ml LV SV MOD BP:       59.4 ml RIGHT VENTRICLE RV S prime:     14.70 cm/s TAPSE (M-mode): 3.1 cm LEFT ATRIUM             Index        RIGHT ATRIUM           Index LA diam:        3.20 cm 1.68 cm/m   RA Area:     12.60 cm LA Vol (A2C):   51.7 ml 27.21 ml/m  RA Volume:   28.40 ml  14.94 ml/m LA Vol (A4C):   48.6 ml 25.57 ml/m LA Biplane Vol: 49.5 ml 26.05 ml/m  AORTIC VALVE                     PULMONIC VALVE AV Area (Vmax):    1.77 cm      PV Vmax:       1.03 m/s AV Area (Vmean):   1.65 cm      PV Peak grad:  4.2 mmHg AV Area (VTI):     1.68 cm AV Vmax:           142.00 cm/s AV Vmean:          106.000 cm/s AV VTI:            0.322 m AV Peak Grad:      8.1 mmHg AV Mean Grad:      5.0 mmHg LVOT Vmax:         111.00 cm/s LVOT Vmean:        77.100 cm/s LVOT VTI:          0.238 m LVOT/AV VTI ratio: 0.74  AORTA Ao Root diam: 3.30 cm Ao Asc diam:  3.50 cm Ao Arch diam: 3.0 cm MITRAL VALVE MV Area (PHT): 3.31 cm     SHUNTS MV Decel Time: 229 msec     Systemic VTI:  0.24 m MV E velocity: 103.00 cm/s  Systemic Diam: 1.70 cm MV A velocity: 124.00 cm/s MV E/A ratio:  0.83 Kirk Ruths MD Electronically signed by Kirk Ruths MD Signature Date/Time: 06/27/2021/1:24:42 PM    Final      Additional studies/ records that were reviewed today include:   ECHO: 07/07/2019  1. Left  ventricular ejection fraction, by estimation, is 70 to 75%. The  left ventricle has hyperdynamic function. The left ventricle has no  regional wall motion abnormalities. Left ventricular diastolic parameters  were normal.   2. Right ventricular systolic function is normal. The right ventricular  size is normal. Tricuspid regurgitation signal is inadequate for assessing  PA pressure.   3. The mitral valve is normal in structure. No evidence of mitral valve  regurgitation. No evidence of mitral stenosis.   4. The aortic valve is normal in structure. Aortic valve regurgitation is  not visualized. No aortic stenosis is present.   5. The inferior vena cava is  normal in size with greater than 50%  respiratory variability, suggesting right atrial pressure of 3 mmHg.    CTA: 07/20/2019 FINDINGS: Coronary calcium score is 22, which places the patient in the 89th percentile for age and sex matched control.   Coronary arteries: Normal coronary origins.  Right dominance.   Right Coronary Artery: No detectable plaque or stenosis.   Left Main Coronary Artery: Minimal ostial mixed atherosclerotic plaque, <25% stenosis.   Left Anterior Descending Coronary Artery: Moderate mixed atherosclerotic plaque in the ostial first diagonal branch, 50-69% stenosis.   Left Circumflex Artery: No detectable plaque or stenosis.   Aorta: Normal size, 32 mm at the mid ascending aorta (level of the PA bifurcation) measured double oblique. No calcifications. No dissection.   Aortic Valve: No calcifications.   Other findings:   Normal pulmonary vein drainage into the left atrium.   Normal left atrial appendage without a thrombus.   Normal size of the pulmonary artery.   IMPRESSION: 1. Moderate CAD in first diagonal artery, CADRADS 3. CT FFR will be performed and reported separately.   2. Coronary calcium score is 22, which places the patient in the 89th percentile for age and sex matched control.   3. Normal coronary origin with right dominance.    ASSESSMENT:    1. Essential hypertension   2. Shortness of breath   3. Precordial pain   4. Hyperlipidemia LDL goal <70   5. Family history of premature CAD   6. Snores   7. Morbid obesity ([place])   8. Depression, unspecified depression type     PLAN:  Joan Joan is a 56 year old female who has a very strong family history for premature CAD and has cardiac risk factors including hypertension, hyperlipidemia, as well as remote tobacco use.  Remotely, she had undergone a nuclear perfusion study in 2013 which showed normal perfusion but continues to experience exertional dyspnea.  A cardiopulmonary  med test had shown impaired maximum oxygen consumption with reduced anaerobic threshold.  She had been noted to have diastolic dysfunction on echocardiography.  Over the years she has had recurrent chest pain symptomatology.  She has been participating in a clinical trial for Crohn's disease at [facility].  When last evaluated in 2021 due to recurrent symptomatology CT coronary morphologic study was performed which showed a calcium score of 22 with suggestion of mixed atherosclerotic plaque less than 25% in the left main and 50 to 69% in the LAD.  FFR was negative.  Echo Doppler study from May 2021 showed hyperdynamic LV function.  She admits to a greater than 50 pound weight gain over the past 6 years and she is now morbidly obese with a BMI of 40.7, height 5 feet and weight 208 pounds.  Most of her weight has occurred over the past 6 months.  She again has noticed  occasional episodes of chest pain and exertional shortness of breath associated with diaphoresis.  She has been under increased stress with the death of her father the day after Christmas.  Her blood pressure today was slightly elevated initially and on repeat by me was improved at 122/76.  She continues to be on losartan HCT 50/12.5 for blood pressure control and apparently is no longer taking metoprolol.  She also is no longer taking rosuvastatin.  She continues to be on investigational study medication for her Crohn's disease.  She has also been on Elavil and Zoloft for depression.  I am recommending complete set of laboratory with a comprehensive metabolic panel, CBC, TSH and lipid studies and LP(a).  I will reassess her 2D echo Doppler study and in light of recurrent symptomatology obtain a new coronary CTA for further evaluation.  I am concerned that she may very well have sleep apnea particularly exacerbated by her weight gain and we will schedule her for a sleep study.  I will see her in the office in follow-up of the above studies and further  recommendations will be made at that time.   Medication Adjustments/Labs and Tests Ordered: Current medicines are reviewed at length with the patient today.  Concerns regarding medicines are outlined above.  Medication changes, Labs and Tests ordered today are listed in the Patient Instructions below. Patient Instructions  Medication Instructions:  Continue same medications *If you need a refill on your cardiac medications before your next appointment, please call your pharmacy*   Lab Work: cmet,cbc,lipid panel,LPa  today    Testing/Procedures: Echo  Sleep Study  Coronary CT  will be scheduled after approved by insurance  Follow instructions below  Follow-Up: At [facility], you and your health needs are our priority.  As part of our continuing mission to provide you with exceptional heart care, we have created designated Provider Care Teams.  These Care Teams include your primary Cardiologist (physician) and Advanced Practice Providers (APPs -  Physician Assistants and Nurse Practitioners) who all work together to provide you with the care you need, when you need it.  We recommend signing up for the patient portal called "MyChart".  Sign up information is provided on this After Visit Summary.  MyChart is used to connect with patients for Virtual Visits (Telemedicine).  Patients are able to view lab/test results, encounter notes, upcoming appointments, etc.  Non-urgent messages can be sent to your provider as well.   To learn more about what you can do with MyChart, go to [email or web address].    Your next appointment:      The format for your next appointment: Office   Provider:  [facility]      Your cardiac CT will be scheduled at one of the below locations:   [facility] [address] [place], [place] [address] [phone]  [place] [address] [place], [place] [address] [phone]  If scheduled at [facility], please arrive at the [facility] and Children's Entrance (Entrance C2) of [facility] 30 minutes prior to test start time. You can use the FREE valet parking offered at entrance C (encouraged to control the heart rate for the test)  Proceed to the [facility] Radiology Department (first floor) to check-in and test prep.  All radiology patients and guests should use entrance C2 at [facility], accessed from [facility], even though the hospital's physical address listed is [address].    If scheduled at [facility], please arrive 15 mins early for check-in and test prep.  Please follow these instructions carefully (unless otherwise directed):    On the Night Before the Test: Be sure to Drink plenty of water. Do not consume any caffeinated/decaffeinated beverages or chocolate 12 hours prior to your test. Do not take any antihistamines 12 hours prior to your test.   On the Day of the Test: Drink plenty of water until 1 hour prior to the test. Do not eat any food 4 hours prior to the test. You may take your regular medications prior to the test.  Take metoprolol 100 mg two hours prior to test. HOLD Losartan/Hydrochlorothiazide morning of the test. FEMALES- please wear underwire-free bra if available, avoid dresses & tight clothing         After the Test: Drink plenty of water. After receiving IV contrast, you may experience a mild flushed feeling. This is normal. On occasion, you may experience a mild rash up to 24 hours after the test. This is not dangerous. If this occurs, you can take Benadryl 25 mg and increase your fluid intake. If you experience trouble breathing, this can be serious. If it is severe call 911 IMMEDIATELY. If it is mild, please call our office.  We will call to schedule your test 2-4 weeks out understanding that some insurance companies will need an  authorization prior to the service being performed.   For non-scheduling related questions, please contact the cardiac imaging nurse navigator should you have any questions/concerns: Marchia Bond, Cardiac Imaging Nurse Navigator Gordy Clement, Cardiac Imaging Nurse Navigator [facility] Direct Office Dial: [phone]   For scheduling needs, including cancellations and rescheduling, please call [place], [phone].    Important Information About Sugar         Signed, Joan Majestic, MD  06/29/2021 12:52 PM    [facility] [address] [place], [place], [place]  [address] Phone: [phone]

## 2021-06-20 ENCOUNTER — Encounter: Payer: Self-pay | Admitting: Cardiovascular Disease

## 2021-06-20 DIAGNOSIS — R0602 Shortness of breath: Secondary | ICD-10-CM

## 2021-06-20 DIAGNOSIS — I10 Essential (primary) hypertension: Secondary | ICD-10-CM

## 2021-06-20 LAB — CBC WITH DIFFERENTIAL/PLATELET
Basophils Absolute: 0.1 10*3/uL (ref 0.0–0.2)
Basos: 0 %
EOS (ABSOLUTE): 0.1 10*3/uL (ref 0.0–0.4)
Eos: 0 %
Hematocrit: 38.7 % (ref 34.0–46.6)
Hemoglobin: 13.1 g/dL (ref 11.1–15.9)
Immature Grans (Abs): 0.1 10*3/uL (ref 0.0–0.1)
Immature Granulocytes: 1 %
Lymphocytes Absolute: 2.5 10*3/uL (ref 0.7–3.1)
Lymphs: 21 %
MCH: 28.9 pg (ref 26.6–33.0)
MCHC: 33.9 g/dL (ref 31.5–35.7)
MCV: 85 fL (ref 79–97)
Monocytes Absolute: 0.8 10*3/uL (ref 0.1–0.9)
Monocytes: 6 %
Neutrophils Absolute: 8.3 10*3/uL — ABNORMAL HIGH (ref 1.4–7.0)
Neutrophils: 72 %
Platelets: 421 10*3/uL (ref 150–450)
RBC: 4.53 x10E6/uL (ref 3.77–5.28)
RDW: 14.2 % (ref 11.7–15.4)
WBC: 11.7 10*3/uL — ABNORMAL HIGH (ref 3.4–10.8)

## 2021-06-20 LAB — COMPREHENSIVE METABOLIC PANEL
ALT: 18 IU/L (ref 0–32)
AST: 22 IU/L (ref 0–40)
Albumin/Globulin Ratio: 1.3 (ref 1.2–2.2)
Albumin: 4.3 g/dL (ref 3.8–4.9)
Alkaline Phosphatase: 131 IU/L — ABNORMAL HIGH (ref 44–121)
BUN/Creatinine Ratio: 17 (ref 9–23)
BUN: 13 mg/dL (ref 6–24)
Bilirubin Total: 0.3 mg/dL (ref 0.0–1.2)
CO2: 25 mmol/L (ref 20–29)
Calcium: 9.3 mg/dL (ref 8.7–10.2)
Chloride: 101 mmol/L (ref 96–106)
Creatinine, Ser: 0.78 mg/dL (ref 0.57–1.00)
Globulin, Total: 3.2 g/dL (ref 1.5–4.5)
Glucose: 117 mg/dL — ABNORMAL HIGH (ref 70–99)
Potassium: 4.6 mmol/L (ref 3.5–5.2)
Sodium: 139 mmol/L (ref 134–144)
Total Protein: 7.5 g/dL (ref 6.0–8.5)
eGFR: 90 mL/min/{1.73_m2} (ref >59)

## 2021-06-20 LAB — LIPID PANEL
Chol/HDL Ratio: 3.5 ratio (ref 0.0–4.4)
Cholesterol, Total: 212 mg/dL — ABNORMAL HIGH (ref 100–199)
HDL: 60 mg/dL (ref >39)
LDL Chol Calc (NIH): 131 mg/dL — ABNORMAL HIGH (ref 0–99)
Triglycerides: 116 mg/dL (ref 0–149)
VLDL Cholesterol Cal: 21 mg/dL (ref 5–40)

## 2021-06-20 LAB — LIPOPROTEIN A (LPA): Lipoprotein (a): 9.2 nmol/L (ref <75.0)

## 2021-06-27 ENCOUNTER — Ambulatory Visit (INDEPENDENT_AMBULATORY_CARE_PROVIDER_SITE_OTHER): Payer: BC Managed Care – PPO

## 2021-06-27 DIAGNOSIS — E785 Hyperlipidemia, unspecified: Secondary | ICD-10-CM

## 2021-06-27 DIAGNOSIS — R072 Precordial pain: Secondary | ICD-10-CM | POA: Diagnosis not present

## 2021-06-27 DIAGNOSIS — I10 Essential (primary) hypertension: Secondary | ICD-10-CM

## 2021-06-27 DIAGNOSIS — R0602 Shortness of breath: Secondary | ICD-10-CM | POA: Diagnosis not present

## 2021-06-27 DIAGNOSIS — R0683 Snoring: Secondary | ICD-10-CM

## 2021-06-27 LAB — ECHOCARDIOGRAM COMPLETE
AR max vel: 1.77 cm2
AV Area VTI: 1.68 cm2
AV Area mean vel: 1.65 cm2
AV Mean grad: 5 mmHg
AV Peak grad: 8.1 mmHg
Ao pk vel: 1.42 m/s
Area-P 1/2: 3.31 cm2
Calc EF: 71.1 %
S' Lateral: 1.51 cm
Single Plane A2C EF: 67.7 %
Single Plane A4C EF: 74.7 %

## 2021-06-27 MED ORDER — PERFLUTREN LIPID MICROSPHERE
1.0000 mL | INTRAVENOUS | Status: AC | PRN
  Administered 2021-06-27: 2 mL via INTRAVENOUS

## 2021-07-04 ENCOUNTER — Telehealth (HOSPITAL_COMMUNITY): Payer: Self-pay | Admitting: *Deleted

## 2021-07-04 NOTE — Telephone Encounter (Signed)
Reaching out to patient to offer assistance regarding upcoming cardiac imaging study; pt verbalizes understanding of appt date/time, parking situation and where to check in, pre-test NPO status and medications ordered, and verified current allergies; name and call back number provided for further questions should they arise  Gordy Clement RN Navigator [facility] [phone] office [phone] cell  Patient to take 120m metoprolol tartrate two hours prior to her cardiac CT scan. She is aware to arrive at 9:30am.

## 2021-07-05 ENCOUNTER — Ambulatory Visit (HOSPITAL_COMMUNITY)
Admission: RE | Admit: 2021-07-05 | Discharge: 2021-07-05 | Disposition: A | Discharge status: Home / Self Care | Payer: BC Managed Care – PPO | Source: Ambulatory Visit | Attending: Cardiovascular Disease | Admitting: Cardiovascular Disease

## 2021-07-05 DIAGNOSIS — R072 Precordial pain: Secondary | ICD-10-CM | POA: Insufficient documentation

## 2021-07-05 DIAGNOSIS — I10 Essential (primary) hypertension: Secondary | ICD-10-CM | POA: Diagnosis not present

## 2021-07-05 DIAGNOSIS — I2510 Atherosclerotic heart disease of native coronary artery without angina pectoris: Secondary | ICD-10-CM | POA: Diagnosis not present

## 2021-07-05 DIAGNOSIS — E785 Hyperlipidemia, unspecified: Secondary | ICD-10-CM | POA: Diagnosis not present

## 2021-07-05 DIAGNOSIS — R0602 Shortness of breath: Secondary | ICD-10-CM | POA: Diagnosis not present

## 2021-07-05 DIAGNOSIS — R0683 Snoring: Secondary | ICD-10-CM | POA: Diagnosis not present

## 2021-07-05 MED ORDER — NITROGLYCERIN 0.4 MG SL SUBL
SUBLINGUAL_TABLET | SUBLINGUAL | Status: AC
  Filled 2021-07-05: qty 2

## 2021-07-05 MED ORDER — NITROGLYCERIN 0.4 MG SL SUBL
0.8000 mg | SUBLINGUAL_TABLET | Freq: Once | SUBLINGUAL | Status: AC
  Administered 2021-07-05: 0.8 mg via SUBLINGUAL

## 2021-07-05 MED ORDER — IOHEXOL 350 MG/ML SOLN
100.0000 mL | Freq: Once | INTRAVENOUS | Status: AC | PRN
Start: 2021-07-05 — End: 2021-07-05
  Administered 2021-07-05: 100 mL via INTRAVENOUS

## 2021-07-12 ENCOUNTER — Telehealth: Payer: Self-pay | Admitting: *Deleted

## 2021-07-12 ENCOUNTER — Other Ambulatory Visit: Payer: Self-pay | Admitting: Cardiovascular Disease

## 2021-07-12 DIAGNOSIS — R0683 Snoring: Secondary | ICD-10-CM

## 2021-07-12 DIAGNOSIS — R0602 Shortness of breath: Secondary | ICD-10-CM

## 2021-07-12 DIAGNOSIS — I10 Essential (primary) hypertension: Secondary | ICD-10-CM

## 2021-07-12 NOTE — Telephone Encounter (Signed)
Prior Authorization for HST sent to [facility] via web portal. Auth # [id number]. Valid dates 07/12/21 to 09/09/21.

## 2021-09-02 ENCOUNTER — Ambulatory Visit (HOSPITAL_BASED_OUTPATIENT_CLINIC_OR_DEPARTMENT_OTHER): Payer: BC Managed Care – PPO | Admitting: Cardiovascular Disease

## 2021-11-18 DIAGNOSIS — J9811 Atelectasis: Secondary | ICD-10-CM | POA: Diagnosis not present

## 2021-11-18 DIAGNOSIS — R0789 Other chest pain: Secondary | ICD-10-CM | POA: Diagnosis not present

## 2021-11-18 DIAGNOSIS — R011 Cardiac murmur, unspecified: Secondary | ICD-10-CM | POA: Diagnosis not present

## 2021-11-18 DIAGNOSIS — Z881 Allergy status to other antibiotic agents status: Secondary | ICD-10-CM | POA: Diagnosis not present

## 2021-11-18 DIAGNOSIS — R7989 Other specified abnormal findings of blood chemistry: Secondary | ICD-10-CM | POA: Diagnosis not present

## 2021-11-18 DIAGNOSIS — R9431 Abnormal electrocardiogram [ECG] [EKG]: Secondary | ICD-10-CM | POA: Diagnosis not present

## 2021-11-18 DIAGNOSIS — Z79899 Other long term (current) drug therapy: Secondary | ICD-10-CM | POA: Diagnosis not present

## 2021-11-18 DIAGNOSIS — R079 Chest pain, unspecified: Secondary | ICD-10-CM | POA: Diagnosis not present

## 2021-11-18 DIAGNOSIS — Z87891 Personal history of nicotine dependence: Secondary | ICD-10-CM | POA: Diagnosis not present

## 2021-11-18 DIAGNOSIS — Z888 Allergy status to other drugs, medicaments and biological substances status: Secondary | ICD-10-CM | POA: Diagnosis not present

## 2021-11-18 DIAGNOSIS — R42 Dizziness and giddiness: Secondary | ICD-10-CM | POA: Diagnosis not present

## 2021-11-18 DIAGNOSIS — Z882 Allergy status to sulfonamides status: Secondary | ICD-10-CM | POA: Diagnosis not present

## 2021-11-19 DIAGNOSIS — J9811 Atelectasis: Secondary | ICD-10-CM | POA: Diagnosis not present

## 2021-11-19 DIAGNOSIS — R7989 Other specified abnormal findings of blood chemistry: Secondary | ICD-10-CM | POA: Diagnosis not present

## 2021-12-11 DIAGNOSIS — I10 Essential (primary) hypertension: Secondary | ICD-10-CM | POA: Diagnosis not present

## 2021-12-11 DIAGNOSIS — I519 Heart disease, unspecified: Secondary | ICD-10-CM | POA: Diagnosis not present

## 2021-12-11 DIAGNOSIS — Z8249 Family history of ischemic heart disease and other diseases of the circulatory system: Secondary | ICD-10-CM | POA: Diagnosis not present

## 2022-07-05 ENCOUNTER — Other Ambulatory Visit: Payer: Self-pay | Admitting: Cardiovascular Disease

## 2022-07-22 ENCOUNTER — Other Ambulatory Visit: Payer: Self-pay | Admitting: Cardiovascular Disease

## 2022-09-14 ENCOUNTER — Other Ambulatory Visit: Payer: Self-pay | Admitting: Cardiovascular Disease

## 2022-09-30 ENCOUNTER — Other Ambulatory Visit: Payer: Self-pay | Admitting: Cardiovascular Disease

## 2023-08-16 IMAGING — CT CT HEART MORP W/ CTA COR W/ SCORE W/ CA W/CM &/OR W/O CM
4 of 7 series · 8 of 20 positions shown, 9 images · non-contrast
Comparison: 07/20/2019
COMPARISON: 07/20/2019

Addendum:
EXAM:
OVER-READ INTERPRETATION  PET-CT CHEST

The following report is an over-read performed by radiologist Dr.
does not include interpretation of cardiac or coronary anatomy or
pathology. The cardiac CT interpretation by the cardiologist is to
be attached.
CLINICAL DATA: 55 Year-old White Female
Cardiac/Coronary  CTA
TECHNIQUE: The patient was scanned on a [employer] Force scanner.

[Series 6: ts diast sharp · axial · 0.39mm/px · z∈[-235,-202]mm · 2 of 243 slices shown]
[im 81/243  lung]
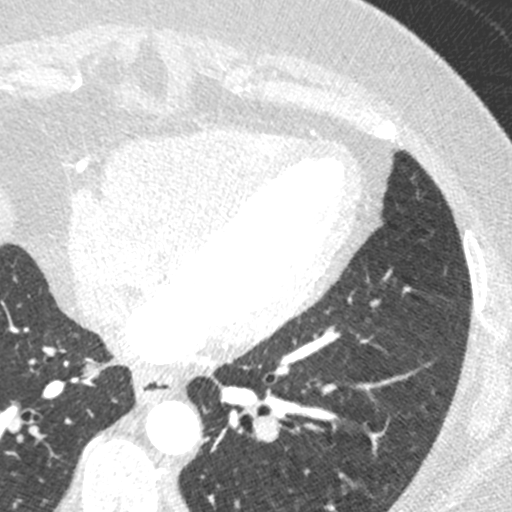
[im 162/243  lung]
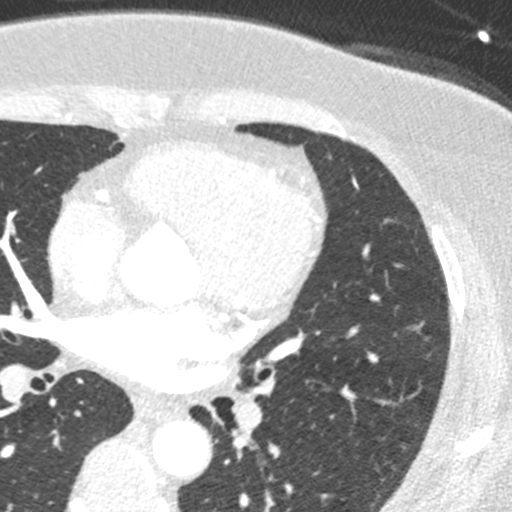

[Series 7: ts syst sharp · axial · 0.39mm/px · z∈[-235,-202]mm · 2 of 243 slices shown]
[im 81/243  lung]
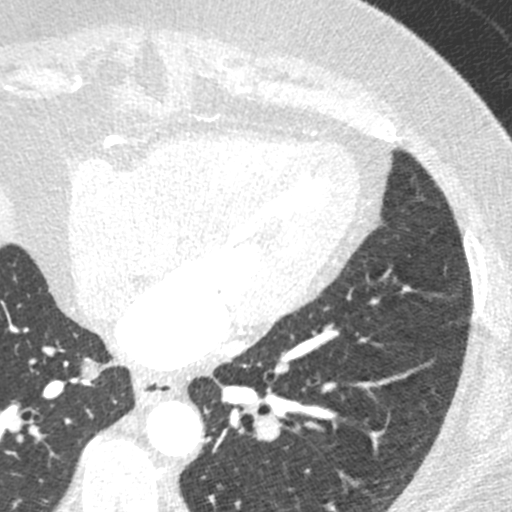
[im 162/243  lung]
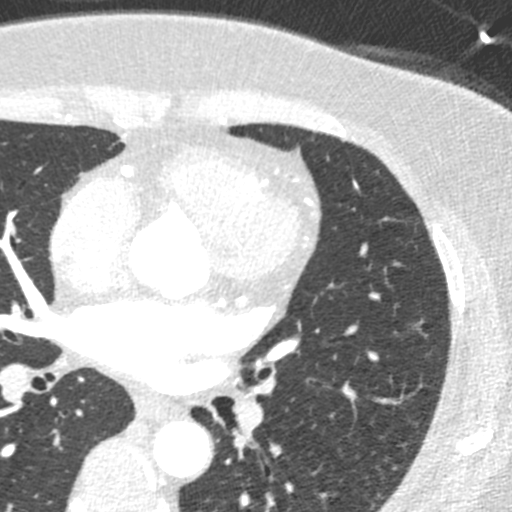

[Series 8: best syst · axial · 0.39mm/px · z∈[-235,-202]mm · 2 of 243 slices shown, 3 images]
[im 81/243  vessel]
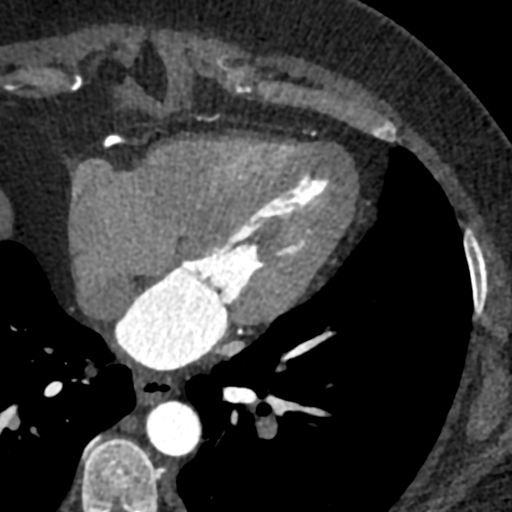
[im 81/243  lung]
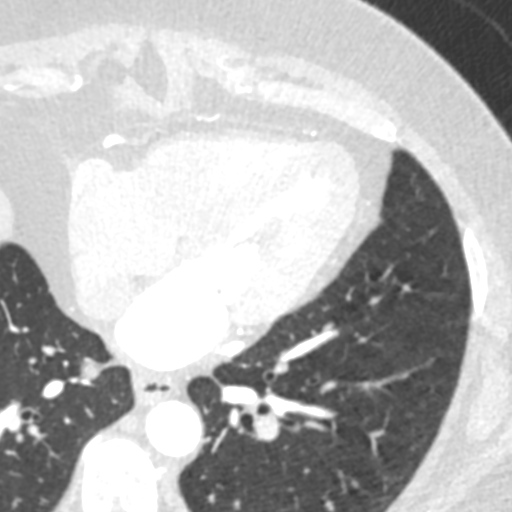
[im 162/243  vessel]
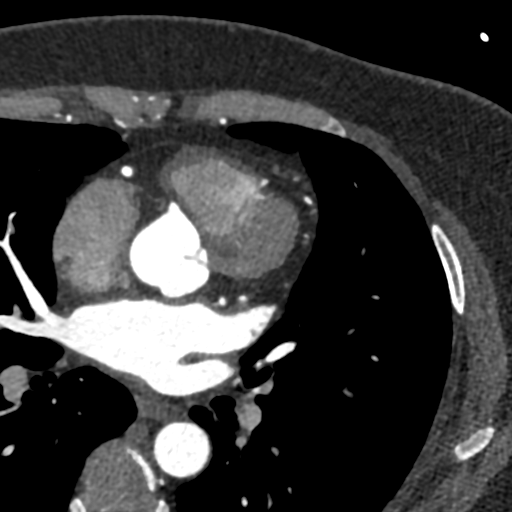

[Series 9: best diast · axial · 0.39mm/px · z∈[-235,-202]mm · 2 of 243 slices shown]
[im 81/243  vessel]
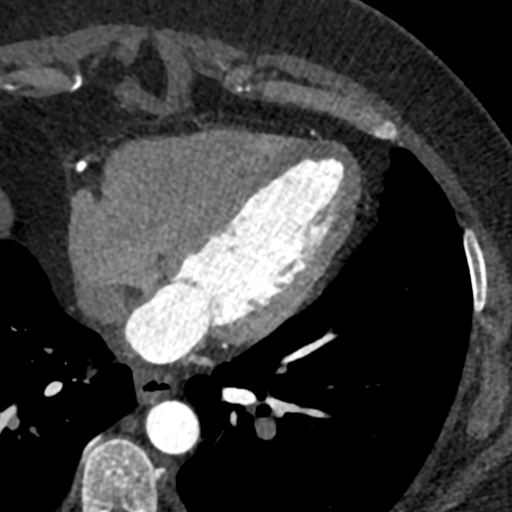
[im 162/243  vessel]
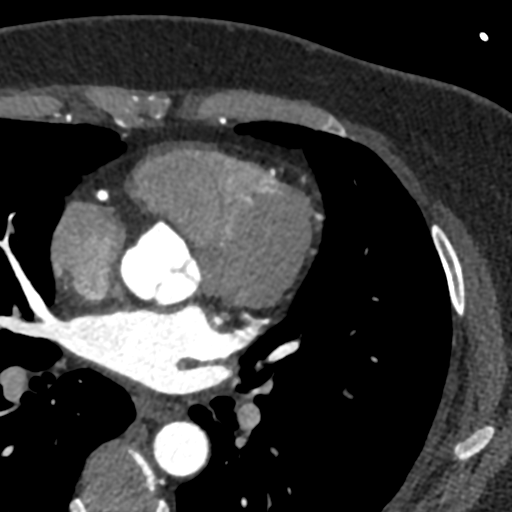

[8 of 20 positions shown; findings below may reference images not displayed]

FINDINGS: No evidence for lymphadenopathy within the visualized mediastinum or
hilar regions.

The visualized lung parenchyma shows no suspicious pulmonary nodule
or mass. No focal airspace consolidation. No effusion.

Visualized portions of the upper abdomen are unremarkable.

No suspicious lytic or sclerotic osseous abnormality.
IMPRESSION: No acute or clinically significant extracardiac findings.
FINDINGS: Scan was triggered in the descending thoracic aorta. Axial
non-contrast 3 mm slices were carried out through the heart. The
data set was analyzed on a dedicated work station and scored using
the Agatson method. Gantry rotation speed was 250 msecs and
collimation was .6 mm. 0.8 mg of sl NTG was given. The 3D data set
was reconstructed in 5% intervals of the 67-82 % of the R-R cycle.
Diastolic phases were analyzed on a dedicated work station using
MPR, MIP and VRT modes. The patient received 100 cc of contrast.

Coronary Arteries:  Normal coronary origin.  Right dominance.

Coronary Calcium Score:

Left main: 15

Left anterior descending artery: 30

Left circumflex artery: 0

Right coronary artery: 0

Ramus intermedius artery: 0

Total: 45

Percentile: 91 for age, sex, and race matched control.

RCA is a large dominant artery that gives rise to PDA and PLA. There
is no significant plaque.

Left main is a large artery that gives rise to LAD, RI and LCX
arteries. Minimal non-obstructive mixed plaque (1-24%) in ostial
vessel.

LAD is a large vessel that gives rise to one large D1 Branch.
Minimal non-obstructive mixed plaque (1-24%) in ostial D1.

LCX is a non-dominant artery that gives rise to one large OM1
branch. There is no significant plaque. Stair Step artifact mid LCX.

There is a ramus intermedius vessel with no significant plaque.

Other findings:

Aorta: Normal size.  No calcifications.  No dissection.

Main Pulmonary Artery: Normal size of the pulmonary artery.

Aortic Valve:  Tri-leaflet.  No calcifications.

Normal pulmonary vein drainage into the left atrium.

Normal left atrial appendage without a thrombus.

Interatrial septum with no communications.

Extra-cardiac findings: See attached radiology report for
non-cardiac structures.

Artifact: Body motion slab artifact.
IMPRESSION: 1. Coronary calcium score of 45. This was 91st percentile for age,
sex, and race matched control.

2. Normal coronary origin with right dominance.

3. CAD-RADS 2. Mild non-obstructive CAD (25-49%). Consider
non-atherosclerotic causes of chest pain. Consider preventive
therapy and risk factor modification.

RECOMMENDATIONS:



If CAC = 0, it is reasonable to withhold statin therapy and reassess
in 5 to 10 years, as long as higher risk conditions are absent
(diabetes mellitus, family history of premature CHD in first degree
relatives (males <55 years; females <65 years), cigarette smoking,
LDL >=190 mg/dL or other independent risk factors).

If CAC is 1 to 99, it is reasonable to initiate statin therapy for
patients >=55 years of age.

If CAC is >=100 or >=75th percentile, it is reasonable to initiate
statin therapy at any age.

Cardiology referral should be considered for patients with CAC
scores =400 or >=75th percentile.

*2062 AHA/ACC/AACVPR/AAPA/ABC/TIGER/SERHI/SINE/Mtnz/CHARLETTO/BRICE/DE PEREZ
Guideline on the Management of Blood Cholesterol: A Report of the
American College of Cardiology/American Heart Association Task Force
on Clinical Practice Guidelines. J Am Coll Cardiol.
2616;73(24):6978-6695.

*** End of Addendum ***
EXAM:
OVER-READ INTERPRETATION  PET-CT CHEST

The following report is an over-read performed by radiologist Dr.
does not include interpretation of cardiac or coronary anatomy or
pathology. The cardiac CT interpretation by the cardiologist is to
be attached.
FINDINGS: No evidence for lymphadenopathy within the visualized mediastinum or
hilar regions.

The visualized lung parenchyma shows no suspicious pulmonary nodule
or mass. No focal airspace consolidation. No effusion.

Visualized portions of the upper abdomen are unremarkable.

No suspicious lytic or sclerotic osseous abnormality.
IMPRESSION: No acute or clinically significant extracardiac findings.
